# Patient Record
Sex: Female | Born: 1950
Health system: Southern US, Community
[De-identification: ages and names within clinical notes are randomized; demographics above are authoritative.]

## PROBLEM LIST (undated history)

## (undated) DIAGNOSIS — Z9889 Other specified postprocedural states: Secondary | ICD-10-CM

## (undated) DIAGNOSIS — R112 Nausea with vomiting, unspecified: Secondary | ICD-10-CM

## (undated) DIAGNOSIS — G8929 Other chronic pain: Secondary | ICD-10-CM

## (undated) DIAGNOSIS — M1990 Unspecified osteoarthritis, unspecified site: Secondary | ICD-10-CM

## (undated) DIAGNOSIS — F329 Major depressive disorder, single episode, unspecified: Secondary | ICD-10-CM

## (undated) DIAGNOSIS — F419 Anxiety disorder, unspecified: Secondary | ICD-10-CM

## (undated) DIAGNOSIS — I10 Essential (primary) hypertension: Secondary | ICD-10-CM

## (undated) DIAGNOSIS — R519 Headache, unspecified: Secondary | ICD-10-CM

## (undated) DIAGNOSIS — E039 Hypothyroidism, unspecified: Secondary | ICD-10-CM

## (undated) HISTORY — PX: TONSILLECTOMY: SUR1361

## (undated) HISTORY — DX: Anxiety disorder, unspecified: F41.9

## (undated) HISTORY — PX: BACK SURGERY: SHX140

## (undated) HISTORY — DX: Major depressive disorder, single episode, unspecified: F32.9

## (undated) HISTORY — PX: ABDOMINAL HYSTERECTOMY: SHX81

## (undated) HISTORY — DX: Unspecified osteoarthritis, unspecified site: M19.90

## (undated) HISTORY — PX: TUBAL LIGATION: SHX77

## (undated) HISTORY — PX: NASAL SINUS SURGERY: SHX719

## (undated) HISTORY — PX: CYST EXCISION: SHX5701

## (undated) HISTORY — PX: OTHER SURGICAL HISTORY: SHX169

---

## 2005-02-17 HISTORY — PX: BLADDER SURGERY: SHX569

## 2013-11-09 DIAGNOSIS — F411 Generalized anxiety disorder: Secondary | ICD-10-CM | POA: Insufficient documentation

## 2013-11-09 DIAGNOSIS — F419 Anxiety disorder, unspecified: Secondary | ICD-10-CM

## 2013-11-09 DIAGNOSIS — R52 Pain, unspecified: Secondary | ICD-10-CM | POA: Insufficient documentation

## 2013-11-09 HISTORY — DX: Anxiety disorder, unspecified: F41.9

## 2013-12-05 ENCOUNTER — Emergency Department (HOSPITAL_COMMUNITY)
Admission: EM | Admit: 2013-12-05 | Discharge: 2013-12-05 | Disposition: A | Discharge status: Home / Self Care | Payer: 59 | Attending: Emergency Medicine | Admitting: Emergency Medicine

## 2013-12-05 ENCOUNTER — Encounter (HOSPITAL_COMMUNITY): Payer: Self-pay | Admitting: Emergency Medicine

## 2013-12-05 DIAGNOSIS — F329 Major depressive disorder, single episode, unspecified: Secondary | ICD-10-CM | POA: Diagnosis not present

## 2013-12-05 DIAGNOSIS — M545 Low back pain: Secondary | ICD-10-CM | POA: Insufficient documentation

## 2013-12-05 DIAGNOSIS — M79606 Pain in leg, unspecified: Secondary | ICD-10-CM | POA: Diagnosis not present

## 2013-12-05 DIAGNOSIS — Z79899 Other long term (current) drug therapy: Secondary | ICD-10-CM | POA: Insufficient documentation

## 2013-12-05 DIAGNOSIS — I10 Essential (primary) hypertension: Secondary | ICD-10-CM | POA: Diagnosis not present

## 2013-12-05 DIAGNOSIS — M549 Dorsalgia, unspecified: Secondary | ICD-10-CM

## 2013-12-05 DIAGNOSIS — F419 Anxiety disorder, unspecified: Secondary | ICD-10-CM | POA: Diagnosis not present

## 2013-12-05 DIAGNOSIS — G8929 Other chronic pain: Secondary | ICD-10-CM | POA: Insufficient documentation

## 2013-12-05 DIAGNOSIS — Z7982 Long term (current) use of aspirin: Secondary | ICD-10-CM | POA: Insufficient documentation

## 2013-12-05 HISTORY — DX: Essential (primary) hypertension: I10

## 2013-12-05 HISTORY — DX: Other chronic pain: G89.29

## 2013-12-05 LAB — CBC WITH DIFFERENTIAL/PLATELET
Basophils Absolute: 0 10*3/uL (ref 0.0–0.1)
Basophils Relative: 0 % (ref 0–1)
Eosinophils Absolute: 0 10*3/uL (ref 0.0–0.7)
Eosinophils Relative: 0 % (ref 0–5)
HCT: 36.6 % (ref 36.0–46.0)
Hemoglobin: 12.6 g/dL (ref 12.0–15.0)
Lymphocytes Relative: 21 % (ref 12–46)
Lymphs Abs: 1.9 10*3/uL (ref 0.7–4.0)
MCH: 31.1 pg (ref 26.0–34.0)
MCHC: 34.4 g/dL (ref 30.0–36.0)
MCV: 90.4 fL (ref 78.0–100.0)
Monocytes Absolute: 0.5 10*3/uL (ref 0.1–1.0)
Monocytes Relative: 6 % (ref 3–12)
Neutro Abs: 6.6 10*3/uL (ref 1.7–7.7)
Neutrophils Relative %: 73 % (ref 43–77)
Platelets: 188 10*3/uL (ref 150–400)
RBC: 4.05 MIL/uL (ref 3.87–5.11)
RDW: 11.9 % (ref 11.5–15.5)
WBC: 9 10*3/uL (ref 4.0–10.5)

## 2013-12-05 LAB — BASIC METABOLIC PANEL
Anion gap: 14 (ref 5–15)
BUN: 9 mg/dL (ref 6–23)
CO2: 24 mEq/L (ref 19–32)
Calcium: 9.2 mg/dL (ref 8.4–10.5)
Chloride: 98 mEq/L (ref 96–112)
Creatinine, Ser: 0.58 mg/dL (ref 0.50–1.10)
GFR calc Af Amer: >90 mL/min (ref >90)
GFR calc non Af Amer: >90 mL/min (ref >90)
Glucose, Bld: 95 mg/dL (ref 70–99)
Potassium: 3.9 mEq/L (ref 3.7–5.3)
Sodium: 136 mEq/L — ABNORMAL LOW (ref 137–147)

## 2013-12-05 MED ORDER — HYDROCHLOROTHIAZIDE 12.5 MG PO CAPS: 12.5000 mg | ORAL_CAPSULE | Freq: Every day | ORAL | Status: DC

## 2013-12-05 MED ORDER — ESTRADIOL 1 MG PO TABS: 1.0000 mg | ORAL_TABLET | Freq: Every day | ORAL | Status: DC

## 2013-12-05 MED ORDER — TRAMADOL HCL 50 MG PO TABS: 50.0000 mg | ORAL_TABLET | Freq: Four times a day (QID) | ORAL | Status: DC | PRN

## 2013-12-05 MED ORDER — ACETAMINOPHEN 500 MG PO TABS
500.0000 mg | ORAL_TABLET | Freq: Four times a day (QID) | ORAL | Status: DC | PRN
Start: 2013-12-05 — End: 2013-12-06

## 2013-12-05 MED ORDER — DIAZEPAM 5 MG PO TABS: 5.0000 mg | ORAL_TABLET | Freq: Four times a day (QID) | ORAL | Status: DC | PRN

## 2013-12-05 MED ORDER — ZOLPIDEM TARTRATE 5 MG PO TABS: 10.0000 mg | ORAL_TABLET | Freq: Every day | ORAL | Status: DC

## 2013-12-05 MED ORDER — ADULT MULTIVITAMIN W/MINERALS CH: 1.0000 | ORAL_TABLET | Freq: Every evening | ORAL | Status: DC

## 2013-12-05 MED ORDER — LISINOPRIL 10 MG PO TABS: 10.0000 mg | ORAL_TABLET | Freq: Every day | ORAL | Status: DC

## 2013-12-05 MED ORDER — LISINOPRIL-HYDROCHLOROTHIAZIDE 10-12.5 MG PO TABS: 1.0000 | ORAL_TABLET | Freq: Every day | ORAL | Status: DC

## 2013-12-05 MED ORDER — ASPIRIN 81 MG PO CHEW: 81.0000 mg | CHEWABLE_TABLET | Freq: Every evening | ORAL | Status: DC

## 2013-12-05 MED ORDER — HYDROMORPHONE HCL 1 MG/ML IJ SOLN
1.0000 mg | Freq: Once | INTRAMUSCULAR | Status: AC
  Administered 2013-12-05: 1 mg via INTRAVENOUS

## 2013-12-05 MED ORDER — ZOLPIDEM TARTRATE 5 MG PO TABS: 5.0000 mg | ORAL_TABLET | Freq: Every day | ORAL | Status: DC

## 2013-12-05 MED ORDER — PAROXETINE HCL 20 MG PO TABS: 40.0000 mg | ORAL_TABLET | Freq: Every day | ORAL | Status: DC

## 2013-12-05 MED ORDER — HYDROMORPHONE HCL 1 MG/ML IJ SOLN
1.0000 mg | Freq: Once | INTRAMUSCULAR | Status: DC
  Filled 2013-12-05: qty 1

## 2013-12-05 NOTE — BH Assessment (Signed)
Tele Assessment Note   Jean Delacruz is an 63 y.o. female.  -Clinician talked to Dr. Wilson Singer regarding need for TTS.  He said that patient came in because of chronic pain and anxiety and depression associated with pain.  Pt does report that she has chronic pain in her back, legs & feet.  She says that she has difficulty with wanting to go out and do anything.  Pain slows her down.  She is in pain if she sits, stands or lays down for too long.  "The more I do, the worse I hurt."  Patient has been feeling this way for "quite a while but it has gotten worse in the last 5 years."  Patient relates that it has affected mood.  She is depressed and anxious much of the time.  She does not feel like going out and socializing and feels nervous about doing so.  Pt relates that there are times she has been very tearful.  Patient denies any current/recurrent SI, HI or A/V hallucinations.  She sees a PA in [place] who works with Dr. Delena Bali there.  That PA has been prescribing paxil and diazepam, ambien.  This PA prescribes all of her medications.  Patient takes medications as directed.  Patient describes sleep being up and down.  She describes anxiety being bad but not to the pont of anxiety attacks.  She did see a counselor, Yancey Flemings who is in [place], last week.  She called him today and he recommended coming to the ED.  A few years ago she saw Dr. Tonna Boehringer in [place] for psychiatry.  -Pt care was discussed with Patriciaann Clan, PA who recommended that patient follow up with outpatient referrals in the [place] area.  Patient care discussed with Dr. Wilson Singer who agrees with this disposition.  Patient discussed this disposition and agreed with following up with contacting Dr. Bo Merino in [place] tomorrow (10/20).  Nurse Vicente Males was given the contact number & address of Dr. Bo Merino so that it could be given to patient.  Pt to be discharged home w/ husband to follow up on referral given.  Axis I:  293.84 Anxiety D/O due to another medical condition Axis II: Deferred Axis III:  Past Medical History  Diagnosis Date  . Chronic pain   . Hypertension    Axis IV: other psychosocial or environmental problems Axis V: 51-60 moderate symptoms  Past Medical History:  Past Medical History  Diagnosis Date  . Chronic pain   . Hypertension     History reviewed. No pertinent past surgical history.  Family History: History reviewed. No pertinent family history.  Social History:  reports that she has never smoked. She does not have any smokeless tobacco history on file. She reports that she does not drink alcohol or use illicit drugs.  Additional Social History:  Alcohol / Drug Use Pain Medications: See PTA medication list Prescriptions: See PTA medication list Over the Counter: Tylenol Arthritis strength; Aleve History of alcohol / drug use?: No history of alcohol / drug abuse  CIWA: CIWA-Ar BP: 158/81 mmHg Pulse Rate: 91 COWS:    PATIENT STRENGTHS: (choose at least two) General fund of knowledge Supportive family/friends  Allergies:  Allergies  Allergen Reactions  . Gabapentin     Swelling lips   . Lyrica [Pregabalin]     Swelling lips     Home Medications:  (Not in a hospital admission)  OB/GYN Status:  No LMP recorded.  General Assessment Data Location of  Assessment: [facility] ED Is this a Tele or Face-to-Face Assessment?: Tele Assessment Is this an Initial Assessment or a Re-assessment for this encounter?: Initial Assessment Living Arrangements: Spouse/significant other Can pt return to current living arrangement?: Yes Admission Status: Voluntary Is patient capable of signing voluntary admission?: Yes Transfer from: [facility] Referral Source: Self/Family/Friend     [place] Living Arrangements: Spouse/significant other Name of Psychiatrist: None Name of Therapist: None  Education Status Highest grade of school patient has completed: 12th  grade  Risk to self with the past 6 months Suicidal Ideation: No Suicidal Intent: No Is patient at risk for suicide?: No Suicidal Plan?: No Access to Means: No What has been your use of drugs/alcohol within the last 12 months?: None Previous Attempts/Gestures: No How many times?: 0 Other Self Harm Risks: None Triggers for Past Attempts: None known Intentional Self Injurious Behavior: None Family Suicide History: Yes (PGF may have committed suicide.) Recent stressful life event(s): Recent negative physical changes ("Living in pain makes me feel stressed, overwhelmed.") Persecutory voices/beliefs?: Yes Depression: Yes Depression Symptoms: Despondent;Loss of interest in usual pleasures;Isolating;Tearfulness Substance abuse history and/or treatment for substance abuse?: No Suicide prevention information given to non-admitted patients: Not applicable  Risk to Others within the past 6 months Homicidal Ideation: No Thoughts of Harm to Others: No Current Homicidal Intent: No Current Homicidal Plan: No Access to Homicidal Means: No Identified Victim: No one History of harm to others?: No Assessment of Violence: None Noted Violent Behavior Description: Pt tense but cooperative. Does patient have access to weapons?: No Criminal Charges Pending?: No Does patient have a court date: No  Psychosis Hallucinations: None noted Delusions: None noted  Mental Status Report Appear/Hygiene: Unremarkable Eye Contact: Good Motor Activity: Freedom of movement;Unremarkable Speech: Logical/coherent Level of Consciousness: Alert Mood: Anxious;Depressed;Sad;Despair Affect: Depressed;Anxious;Sad Anxiety Level: Severe Thought Processes: Coherent;Relevant Judgement: Unimpaired Orientation: Person;Place;Time;Situation Obsessive Compulsive Thoughts/Behaviors: None  Cognitive Functioning Concentration: Decreased Memory: Recent Impaired;Remote Intact IQ: Average Insight: Fair Impulse Control:  Good Appetite: Fair Weight Loss: 0 Weight Gain: 0 Sleep: Decreased Total Hours of Sleep:  (Up and down.) Vegetative Symptoms: Staying in bed;Decreased grooming  ADLScreening [facility] Assessment Services) Patient's cognitive ability adequate to safely complete daily activities?: Yes Patient able to express need for assistance with ADLs?: Yes Independently performs ADLs?: Yes (appropriate for developmental age)  Prior Inpatient Therapy Prior Inpatient Therapy: No Prior Therapy Dates: None Prior Therapy Facilty/Provider(s): None Reason for Treatment: N/A  Prior Outpatient Therapy Prior Outpatient Therapy: Yes Prior Therapy Dates: 2014 Prior Therapy Facilty/Provider(s): [facility] in [place] Reason for Treatment: Counseling  ADL Screening (condition at time of admission) Patient's cognitive ability adequate to safely complete daily activities?: Yes Is the patient deaf or have difficulty hearing?: No Does the patient have difficulty seeing, even when wearing glasses/contacts?: No (Has glasses (doesn't like to wear them).) Does the patient have difficulty concentrating, remembering, or making decisions?: No Patient able to express need for assistance with ADLs?: Yes Does the patient have difficulty dressing or bathing?: No (Tasks take longer due to the pain.) Independently performs ADLs?: Yes (appropriate for developmental age) Does the patient have difficulty walking or climbing stairs?: Yes Weakness of Legs: Both (Right leg is worse than the other leg.) Weakness of Arms/Hands: None (Extremities stay cold.)       Abuse/Neglect Assessment (Assessment to be complete while patient is alone) Physical Abuse: Denies Verbal Abuse: Denies Sexual Abuse: Denies Exploitation of patient/patient's resources: Denies Self-Neglect: Denies Values / Beliefs Cultural Requests During Hospitalization:  None Spiritual Requests During Hospitalization: None   Advance Directives (For Healthcare) Does  patient have an advance directive?: No Would patient like information on creating an advanced directive?: No - patient declined information    Additional Information 1:1 In Past 12 Months?: No CIRT Risk: No Elopement Risk: No Does patient have medical clearance?: Yes     Disposition:  Disposition Initial Assessment Completed for this Encounter: Yes Disposition of Patient: Outpatient treatment;Referred to Type of outpatient treatment: Adult Patient referred to: Other (Comment)  Raymondo Band 12/05/2013 9:39 PM

## 2013-12-05 NOTE — ED Notes (Signed)
Koh[place], MD at bedside. 

## 2013-12-05 NOTE — ED Provider Notes (Signed)
Jean Delacruz is a 63 y.o. female with a PMHx of chronic pain and HTN who presents to the Emergency Department complaining of constant severe lower back pain that started a couple weeks ago. She reports that she has been having chronic back pain for about 5 years, but the pain has recently worsened over the past couple of weeks. She reports no injury at the start of the pain and denies any recent injuries. She describes the pain as a aching sensation. She reports that the pain radiates down her bilateral lower extremities, with the right being worse than the left. Stated that her feet have been going numb. She states that pain makes her feel "overwhelmed", stressed, and depressed. She reports some dysphoric mood, sleep disturbance and decreased appetite. Significant other that is with her reported that she has not been herself lately and that he is concerned. Reported that she cries often. She denies any SI, HI, loss of sensation, bladder incontinence, chest pain, or SOB, abdominal pain.   Patient tearful during exam. Negative signs of respiratory distress. Heart rate and rhythm normal. Lungs good auscultation to upper and lower lobes bilaterally. Negative for Ms. identified to the spine-discomfort upon palpation to lumbosacral mid spinal and paravertebral regions bilaterally. Full range of motion noted to the upper and lower extremities bilaterally without difficulty or ataxia. Pulses palpable and strong DP and PT 2+. Negative saddle paresthesias. Strength intact to upper lower extremities. Cap refill less than 3 seconds. Fingers and toes cold to the touch.  Exam unremarkable. Patient has been dealing with this discomfort for approximately 5 years, has been seen by a neurologist, primary care provider, spine surgeon. Reported that she's been more stressed than she has ever been. Tearful. Appears to be depression-denied HI SI. Patient will need to be medically cleared for depression. Moved to main ED for further  evaluation.   Jamse Mead, PA-C 12/05/13 1826

## 2013-12-05 NOTE — ED Notes (Signed)
Report given to Scott, RN

## 2013-12-05 NOTE — ED Notes (Signed)
Pt is endorsing swelling of the lips. This RN does not observe swelling. Wilson Singer, MD is aware and will come see the pt.

## 2013-12-05 NOTE — ED Notes (Signed)
Pt. Crying and upset due to pain in her back and legs this has been going on since 2011

## 2013-12-05 NOTE — ED Provider Notes (Signed)
CSN: [id number]     Arrival date & time 12/05/13  1553 History   First MD Initiated Contact with Patient 12/05/13 1647     Chief Complaint  Patient presents with  . Back Pain  . Leg Pain     (Consider location/radiation/quality/duration/timing/severity/associated sxs/prior Treatment) HPI  62yF with lower back and LE pain. Ongoing for years. Feels like cannot adequately control it. "stresses" her. Feels like she cannot function or even leave house because of it.Previous surgery. Nothing recently. Denies new trauma. No fever or chills. No urinary complaints. Denies SI/HI. Has psychiatrist in [place].   Past Medical History  Diagnosis Date  . Chronic pain   . Hypertension    History reviewed. No pertinent past surgical history. History reviewed. No pertinent family history. History  Substance Use Topics  . Smoking status: Never Smoker   . Smokeless tobacco: Not on file  . Alcohol Use: No   OB History   Grav Para Term Preterm Abortions TAB SAB Ect Mult Living                 Review of Systems  All systems reviewed and negative, other than as noted in HPI.   Allergies  Gabapentin and Lyrica  Home Medications   Prior to Admission medications   Medication Sig Start Date End Date Taking? Authorizing Provider  acetaminophen (TYLENOL) 500 MG tablet Take 500 mg by mouth every 6 (six) hours as needed for moderate pain.   Yes Historical Provider, MD  aspirin 81 MG tablet Take 81 mg by mouth every evening.   Yes Historical Provider, MD  diazepam (VALIUM) 5 MG tablet Take 5 mg by mouth every 6 (six) hours as needed for anxiety.   Yes Historical Provider, MD  estradiol (ESTRACE) 1 MG tablet Take 1 mg by mouth daily.   Yes Historical Provider, MD  KRILL OIL PO Take 1 tablet by mouth every evening.   Yes Historical Provider, MD  lisinopril-hydrochlorothiazide (PRINZIDE,ZESTORETIC) 10-12.5 MG per tablet Take 1 tablet by mouth daily.   Yes Historical Provider, MD  Multiple Vitamin  (MULTIVITAMIN WITH MINERALS) TABS tablet Take 1 tablet by mouth every evening.   Yes Historical Provider, MD  PARoxetine (PAXIL) 40 MG tablet Take 40 mg by mouth every morning.   Yes Historical Provider, MD  traMADol (ULTRAM) 50 MG tablet Take by mouth every 6 (six) hours as needed for moderate pain.   Yes Historical Provider, MD  zolpidem (AMBIEN) 10 MG tablet Take 10 mg by mouth at bedtime. Take every night per patient   Yes Historical Provider, MD   BP 163/99  Pulse 99  Temp(Src) 97.9 F (36.6 C) (Oral)  Resp 20  SpO2 100% Physical Exam  Nursing note and vitals reviewed. Constitutional: She appears well-developed and well-nourished. No distress.  HENT:  Head: Normocephalic and atraumatic.  Eyes: Conjunctivae are normal. Right eye exhibits no discharge. Left eye exhibits no discharge.  Neck: Neck supple.  Cardiovascular: Normal rate, regular rhythm and normal heart sounds.  Exam reveals no gallop and no friction rub.   No murmur heard. Pulmonary/Chest: Effort normal and breath sounds normal. No respiratory distress.  Abdominal: Soft. She exhibits no distension. There is no tenderness.  Musculoskeletal: She exhibits no edema and no tenderness.  Well-healed midline surgical scars back.   Neurological: She is alert.  Strength 5/5 b/l LE. Sensation intact to light touch. Patellar reflexes 1+ b/l. Palpable DP pulse.  Skin: Skin is warm and dry.  Psychiatric: She has a  normal mood and affect. Her behavior is normal. Thought content normal.    ED Course  Procedures (including critical care time) Labs Review Labs Reviewed  BASIC METABOLIC PANEL - Abnormal; Notable for the following:    Sodium 136 (*)    All other components within normal limits  CBC WITH DIFFERENTIAL  URINE RAPID DRUG SCREEN (HOSP PERFORMED)  URINALYSIS, ROUTINE W REFLEX MICROSCOPIC    Imaging Review No results found.   EKG Interpretation None      MDM   Final diagnoses:  Back pain, unspecified location   Anxiety    63 year old female with chronic back pain and lower extremity pain. This really seems to be more an anxiety/depression issue than anything else. Her pain complaints are chronic in nature and denies any acute change. No recent trauma. She is neurovascularly intact. Patient is extremely anxious and tearful during my exam. She denies any suicidal or homicidal ideation. She keeps saying "stress" was the reason she is so upset but will not provide me with any specifics really. Low suspicion for emergent medical process. She would like to speak with a counselor. I feel this is reasonable. I find no basis to involuntarily commit her for psychiatric reasons though. I feel she is medically stable for discharge as well.    Virgel Manifold, MD 12/12/13 [phone]

## 2013-12-05 NOTE — ED Notes (Signed)
This RN spoke with Beverely Low at [facility], who states that the pt needs to follow up with Dr. Bo Merino in [place]. Address is [address], [place] and the phone number is [phone].

## 2013-12-05 NOTE — ED Notes (Signed)
Pt c/o lower back and bilateral chronic leg pain x years worse over last months; pt sts "she thinks she has fibromyalgia"

## 2013-12-05 NOTE — ED Notes (Signed)
Pt placed in paper scrubs.

## 2013-12-05 NOTE — ED Notes (Signed)
TTS at bedside. 

## 2013-12-08 NOTE — ED Provider Notes (Signed)
Medical screening examination/treatment/procedure(s) were performed by non-physician practitioner and as supervising physician I was immediately available for consultation/collaboration.   EKG Interpretation   Date/Time:  Monday December 05 2013 20:09:37 EDT Ventricular Rate:  90 PR Interval:  150 QRS Duration: 84 QT Interval:  384 QTC Calculation: 470 R Axis:   84 Text Interpretation:  Sinus rhythm LAE, consider biatrial enlargement  Borderline right axis deviation Anteroseptal infarct, old Minimal ST  depression, lateral leads ED PHYSICIAN INTERPRETATION AVAILABLE IN [place]  [place] Confirmed by TEST, Record ([id number]) on 12/07/2013 7:52:28 AM       Virgel Manifold, MD 12/08/13 [phone]

## 2014-01-16 ENCOUNTER — Ambulatory Visit (INDEPENDENT_AMBULATORY_CARE_PROVIDER_SITE_OTHER): Payer: 59

## 2014-01-16 VITALS — BP 123/78 | HR 84 | Resp 12

## 2014-01-16 DIAGNOSIS — M792 Neuralgia and neuritis, unspecified: Secondary | ICD-10-CM

## 2014-01-16 DIAGNOSIS — R52 Pain, unspecified: Secondary | ICD-10-CM

## 2014-01-16 DIAGNOSIS — M722 Plantar fascial fibromatosis: Secondary | ICD-10-CM

## 2014-01-16 NOTE — Progress Notes (Signed)
   Subjective:    Patient ID: Jean Delacruz, female    DOB: [birth year], 63 y.o.   MRN: [id number]  HPI  PT STATED HAVE HISTORY OF PLANTAR FASCIITIS AND B/L BOTTOM FOOT ARE BEEN HURTING FOR 5 YEARS. THE FOOT ARE GETTING WORSE AND GET AGGRAVATED BY WALKING. TRIED SOAKING WITH EPSON SALT AND IT HELP SOME.  Review of Systems  HENT:       RINGING EARS  Endocrine: Positive for cold intolerance.  Musculoskeletal: Positive for myalgias and back pain.       Objective:   Physical Exam 63 year old white female well-developed well-nourished oriented 3 presents this time with a multiple year history of inferior arch pain has pain on first up in the morning or getting a fall. Of rest the pain is mid arch area of both heels nail band of plantar fascial at mid arch bilateral. Patient has no history of acute injury or trauma currently wearing Allegra type shoes also wears Birkenstock type shoes and some kind of a support in her athletic shoes as well. It hurts more when she gets tries to walk without shoes or worsen dry shoe or thin soled shoe at times. Also is having cold sensation in her feet at times. Does have a history of back problems had multiple steroid injections has had MRI for her back and nerve conduction studies done in 2011 or 2012 more than 3 years ago there were some findings of arthritis conductions were unremarkable however many things could change in 3 years and patient continues to have some evidence of radiculopathy Which are changing and abnormal sensation in her feet at times. Lower extremity objective findings as follows vascular status is intact pedal pulses palpable DP +2 over 4 bilateral PT +2 over 4 bilateral mild varicosities noted Refill time 3 seconds all digits epicritic and proprioceptive sensations intact and symmetric bilateral there is normal plantar response and DTRs. Dermatologically skin color pigment normal hair growth absent distally nails unremarkable dermatologic exam  skin color pigment hair growth again skin or pigment normal hair growth absent there is an orthopedic exam tenderness on palpation of the medial band plantar fascia x-rays reveal no inferior calcaneal spurring fascial thickening on lateral projection there is also some slight spurring of the first metatarsal head at the dorsally of lateral projection with a slight cavus or higher arch foot type being identified.       Assessment & Plan:  Assessment plantar fasciitis/heel spur syndrome is identified patient is given some information also can have posse some neuritis or neuralgia contributing to this cannot rule out a mild radiculopathy suggested if she continues to have back problems or continued temperature sensation changes would recommend follow-up with a another neurologist or back specialist may need to update her exam and MRI and nerve conduction studies in the future. At this time gave the option of a prescription orthoses which patient declined she will continue to use OTC orthoses such as the [place] or Allegria. Patient will maintain stable shoes at all times also does Tylenol or NSAID as needed for pain patient is advised there is no significant biomechanical fault in the foot no fracture no inferior spurring more likely mid band plantar fascial strain although minimal and cannot rule out a radiculopathy or neuritis. Maintain good stable shoes may be candidate for custom orthoses in the future follow-up as needed in the future if symptoms persist or Claudette Stapler DPM

## 2014-01-16 NOTE — Patient Instructions (Signed)
Plantar Fasciitis (Heel Spur Syndrome) with Rehab The plantar fascia is a fibrous, ligament-like, soft-tissue structure that spans the bottom of the foot. Plantar fasciitis is a condition that causes pain in the foot due to inflammation of the tissue. SYMPTOMS   Pain and tenderness on the underneath side of the foot.  Pain that worsens with standing or walking. CAUSES  Plantar fasciitis is caused by irritation and injury to the plantar fascia on the underneath side of the foot. Common mechanisms of injury include:  Direct trauma to bottom of the foot.  Damage to a small nerve that runs under the foot where the main fascia attaches to the heel bone.  Stress placed on the plantar fascia due to bone spurs. RISK INCREASES WITH:   Activities that place stress on the plantar fascia (running, jumping, pivoting, or cutting).  Poor strength and flexibility.  Improperly fitted shoes.  Tight calf muscles.  Flat feet.  Failure to warm-up properly before activity.  Obesity. PREVENTION  Warm up and stretch properly before activity.  Allow for adequate recovery between workouts.  Maintain physical fitness:  Strength, flexibility, and endurance.  Cardiovascular fitness.  Maintain a health body weight.  Avoid stress on the plantar fascia.  Wear properly fitted shoes, including arch supports for individuals who have flat feet. PROGNOSIS  If treated properly, then the symptoms of plantar fasciitis usually resolve without surgery. However, occasionally surgery is necessary. RELATED COMPLICATIONS   Recurrent symptoms that may result in a chronic condition.  Problems of the lower back that are caused by compensating for the injury, such as limping.  Pain or weakness of the foot during push-off following surgery.  Chronic inflammation, scarring, and partial or complete fascia tear, occurring more often from repeated injections. TREATMENT  Treatment initially involves the use of  ice and medication to help reduce pain and inflammation. The use of strengthening and stretching exercises may help reduce pain with activity, especially stretches of the Achilles tendon. These exercises may be performed at home or with a therapist. Your caregiver may recommend that you use heel cups of arch supports to help reduce stress on the plantar fascia. Occasionally, corticosteroid injections are given to reduce inflammation. If symptoms persist for greater than 6 months despite non-surgical (conservative), then surgery may be recommended.  MEDICATION   If pain medication is necessary, then nonsteroidal anti-inflammatory medications, such as aspirin and ibuprofen, or other minor pain relievers, such as acetaminophen, are often recommended.  Do not take pain medication within 7 days before surgery.  Prescription pain relievers may be given if deemed necessary by your caregiver. Use only as directed and only as much as you need.  Corticosteroid injections may be given by your caregiver. These injections should be reserved for the most serious cases, because they may only be given a certain number of times. HEAT AND COLD  Cold treatment (icing) relieves pain and reduces inflammation. Cold treatment should be applied for 10 to 15 minutes every 2 to 3 hours for inflammation and pain and immediately after any activity that aggravates your symptoms. Use ice packs or massage the area with a piece of ice (ice massage).  Heat treatment may be used prior to performing the stretching and strengthening activities prescribed by your caregiver, physical therapist, or athletic trainer. Use a heat pack or soak the injury in warm water. SEEK IMMEDIATE MEDICAL CARE IF:  Treatment seems to offer no benefit, or the condition worsens.  Any medications produce adverse side effects. EXERCISES RANGE   OF MOTION (ROM) AND STRETCHING EXERCISES - Plantar Fasciitis (Heel Spur Syndrome) These exercises may help you  when beginning to rehabilitate your injury. Your symptoms may resolve with or without further involvement from your physician, physical therapist or athletic trainer. While completing these exercises, remember:   Restoring tissue flexibility helps normal motion to return to the joints. This allows healthier, less painful movement and activity.  An effective stretch should be held for at least 30 seconds.  A stretch should never be painful. You should only feel a gentle lengthening or release in the stretched tissue. RANGE OF MOTION - Toe Extension, Flexion  Sit with your right / left leg crossed over your opposite knee.  Grasp your toes and gently pull them back toward the top of your foot. You should feel a stretch on the bottom of your toes and/or foot.  Hold this stretch for __________ seconds.  Now, gently pull your toes toward the bottom of your foot. You should feel a stretch on the top of your toes and or foot.  Hold this stretch for __________ seconds. Repeat __________ times. Complete this stretch __________ times per day.  RANGE OF MOTION - Ankle Dorsiflexion, Active Assisted  Remove shoes and sit on a chair that is preferably not on a carpeted surface.  Place right / left foot under knee. Extend your opposite leg for support.  Keeping your heel down, slide your right / left foot back toward the chair until you feel a stretch at your ankle or calf. If you do not feel a stretch, slide your bottom forward to the edge of the chair, while still keeping your heel down.  Hold this stretch for __________ seconds. Repeat __________ times. Complete this stretch __________ times per day.  STRETCH - Gastroc, Standing  Place hands on wall.  Extend right / left leg, keeping the front knee somewhat bent.  Slightly point your toes inward on your back foot.  Keeping your right / left heel on the floor and your knee straight, shift your weight toward the wall, not allowing your back to  arch.  You should feel a gentle stretch in the right / left calf. Hold this position for __________ seconds. Repeat __________ times. Complete this stretch __________ times per day. STRETCH - Soleus, Standing  Place hands on wall.  Extend right / left leg, keeping the other knee somewhat bent.  Slightly point your toes inward on your back foot.  Keep your right / left heel on the floor, bend your back knee, and slightly shift your weight over the back leg so that you feel a gentle stretch deep in your back calf.  Hold this position for __________ seconds. Repeat __________ times. Complete this stretch __________ times per day. STRETCH - Gastrocsoleus, Standing  Note: This exercise can place a lot of stress on your foot and ankle. Please complete this exercise only if specifically instructed by your caregiver.   Place the ball of your right / left foot on a step, keeping your other foot firmly on the same step.  Hold on to the wall or a rail for balance.  Slowly lift your other foot, allowing your body weight to press your heel down over the edge of the step.  You should feel a stretch in your right / left calf.  Hold this position for __________ seconds.  Repeat this exercise with a slight bend in your right / left knee. Repeat __________ times. Complete this stretch __________ times per day.    STRENGTHENING EXERCISES - Plantar Fasciitis (Heel Spur Syndrome)  These exercises may help you when beginning to rehabilitate your injury. They may resolve your symptoms with or without further involvement from your physician, physical therapist or athletic trainer. While completing these exercises, remember:   Muscles can gain both the endurance and the strength needed for everyday activities through controlled exercises.  Complete these exercises as instructed by your physician, physical therapist or athletic trainer. Progress the resistance and repetitions only as guided. STRENGTH -  Towel Curls  Sit in a chair positioned on a non-carpeted surface.  Place your foot on a towel, keeping your heel on the floor.  Pull the towel toward your heel by only curling your toes. Keep your heel on the floor.  If instructed by your physician, physical therapist or athletic trainer, add ____________________ at the end of the towel. Repeat __________ times. Complete this exercise __________ times per day. STRENGTH - Ankle Inversion  Secure one end of a rubber exercise band/tubing to a fixed object (table, pole). Loop the other end around your foot just before your toes.  Place your fists between your knees. This will focus your strengthening at your ankle.  Slowly, pull your big toe up and in, making sure the band/tubing is positioned to resist the entire motion.  Hold this position for __________ seconds.  Have your muscles resist the band/tubing as it slowly pulls your foot back to the starting position. Repeat __________ times. Complete this exercises __________ times per day.  Document Released: 02/03/2005 Document Revised: 04/28/2011 Document Reviewed: 05/18/2008 [employer] Patient Information 2015 [employer]. This information is not intended to replace advice given to you by your health care provider. Make sure you discuss any questions you have with your health care provider.  

## 2014-05-19 DIAGNOSIS — F32A Depression, unspecified: Secondary | ICD-10-CM

## 2014-05-19 DIAGNOSIS — F329 Major depressive disorder, single episode, unspecified: Secondary | ICD-10-CM | POA: Insufficient documentation

## 2014-05-19 HISTORY — DX: Depression, unspecified: F32.A

## 2014-05-19 HISTORY — DX: Major depressive disorder, single episode, unspecified: F32.9

## 2014-05-20 DIAGNOSIS — F3112 Bipolar disorder, current episode manic without psychotic features, moderate: Secondary | ICD-10-CM | POA: Insufficient documentation

## 2014-05-23 DIAGNOSIS — E673 Hypervitaminosis D: Secondary | ICD-10-CM | POA: Insufficient documentation

## 2014-08-07 DIAGNOSIS — I10 Essential (primary) hypertension: Secondary | ICD-10-CM | POA: Insufficient documentation

## 2014-08-07 DIAGNOSIS — K5900 Constipation, unspecified: Secondary | ICD-10-CM | POA: Insufficient documentation

## 2014-08-07 DIAGNOSIS — E785 Hyperlipidemia, unspecified: Secondary | ICD-10-CM | POA: Insufficient documentation

## 2015-03-13 DIAGNOSIS — F329 Major depressive disorder, single episode, unspecified: Secondary | ICD-10-CM | POA: Diagnosis not present

## 2015-03-13 DIAGNOSIS — G4700 Insomnia, unspecified: Secondary | ICD-10-CM | POA: Diagnosis not present

## 2015-03-13 DIAGNOSIS — Z6824 Body mass index (BMI) 24.0-24.9, adult: Secondary | ICD-10-CM | POA: Diagnosis not present

## 2015-03-13 DIAGNOSIS — I10 Essential (primary) hypertension: Secondary | ICD-10-CM | POA: Diagnosis not present

## 2015-03-13 DIAGNOSIS — E559 Vitamin D deficiency, unspecified: Secondary | ICD-10-CM | POA: Diagnosis not present

## 2015-03-13 DIAGNOSIS — Z9181 History of falling: Secondary | ICD-10-CM | POA: Diagnosis not present

## 2015-03-13 DIAGNOSIS — R5381 Other malaise: Secondary | ICD-10-CM | POA: Diagnosis not present

## 2015-03-13 DIAGNOSIS — Z0000 Encounter for general adult medical examination without abnormal findings: Secondary | ICD-10-CM | POA: Diagnosis not present

## 2015-03-13 DIAGNOSIS — E785 Hyperlipidemia, unspecified: Secondary | ICD-10-CM | POA: Diagnosis not present

## 2015-03-13 DIAGNOSIS — F411 Generalized anxiety disorder: Secondary | ICD-10-CM | POA: Diagnosis not present

## 2015-03-13 DIAGNOSIS — Z23 Encounter for immunization: Secondary | ICD-10-CM | POA: Diagnosis not present

## 2015-03-13 DIAGNOSIS — Z1389 Encounter for screening for other disorder: Secondary | ICD-10-CM | POA: Diagnosis not present

## 2015-04-14 DIAGNOSIS — F419 Anxiety disorder, unspecified: Secondary | ICD-10-CM | POA: Diagnosis not present

## 2015-04-14 DIAGNOSIS — I5032 Chronic diastolic (congestive) heart failure: Secondary | ICD-10-CM | POA: Diagnosis not present

## 2015-04-14 DIAGNOSIS — E782 Mixed hyperlipidemia: Secondary | ICD-10-CM | POA: Diagnosis not present

## 2015-04-14 DIAGNOSIS — N951 Menopausal and female climacteric states: Secondary | ICD-10-CM | POA: Diagnosis not present

## 2015-04-14 DIAGNOSIS — M545 Low back pain: Secondary | ICD-10-CM | POA: Diagnosis not present

## 2015-04-14 DIAGNOSIS — Z6823 Body mass index (BMI) 23.0-23.9, adult: Secondary | ICD-10-CM | POA: Diagnosis not present

## 2015-04-14 DIAGNOSIS — E039 Hypothyroidism, unspecified: Secondary | ICD-10-CM | POA: Diagnosis not present

## 2015-04-14 DIAGNOSIS — I340 Nonrheumatic mitral (valve) insufficiency: Secondary | ICD-10-CM | POA: Diagnosis not present

## 2015-04-14 DIAGNOSIS — I700 Atherosclerosis of aorta: Secondary | ICD-10-CM | POA: Diagnosis not present

## 2015-04-14 DIAGNOSIS — Z0000 Encounter for general adult medical examination without abnormal findings: Secondary | ICD-10-CM | POA: Diagnosis not present

## 2015-04-14 DIAGNOSIS — I110 Hypertensive heart disease with heart failure: Secondary | ICD-10-CM | POA: Diagnosis not present

## 2015-06-15 DIAGNOSIS — Z6822 Body mass index (BMI) 22.0-22.9, adult: Secondary | ICD-10-CM | POA: Diagnosis not present

## 2015-06-15 DIAGNOSIS — L659 Nonscarring hair loss, unspecified: Secondary | ICD-10-CM | POA: Diagnosis not present

## 2015-06-15 DIAGNOSIS — G4700 Insomnia, unspecified: Secondary | ICD-10-CM | POA: Diagnosis not present

## 2015-06-15 DIAGNOSIS — I10 Essential (primary) hypertension: Secondary | ICD-10-CM | POA: Diagnosis not present

## 2015-06-15 DIAGNOSIS — F411 Generalized anxiety disorder: Secondary | ICD-10-CM | POA: Diagnosis not present

## 2015-06-15 DIAGNOSIS — E039 Hypothyroidism, unspecified: Secondary | ICD-10-CM | POA: Diagnosis not present

## 2015-06-15 DIAGNOSIS — F329 Major depressive disorder, single episode, unspecified: Secondary | ICD-10-CM | POA: Diagnosis not present

## 2015-06-15 DIAGNOSIS — E785 Hyperlipidemia, unspecified: Secondary | ICD-10-CM | POA: Diagnosis not present

## 2015-06-15 DIAGNOSIS — M545 Low back pain: Secondary | ICD-10-CM | POA: Diagnosis not present

## 2015-06-15 DIAGNOSIS — N951 Menopausal and female climacteric states: Secondary | ICD-10-CM | POA: Diagnosis not present

## 2015-06-20 DIAGNOSIS — Z1231 Encounter for screening mammogram for malignant neoplasm of breast: Secondary | ICD-10-CM | POA: Diagnosis not present

## 2015-09-19 DIAGNOSIS — G4700 Insomnia, unspecified: Secondary | ICD-10-CM | POA: Diagnosis not present

## 2015-09-19 DIAGNOSIS — E039 Hypothyroidism, unspecified: Secondary | ICD-10-CM | POA: Diagnosis not present

## 2015-09-19 DIAGNOSIS — N951 Menopausal and female climacteric states: Secondary | ICD-10-CM | POA: Diagnosis not present

## 2015-09-19 DIAGNOSIS — I10 Essential (primary) hypertension: Secondary | ICD-10-CM | POA: Diagnosis not present

## 2015-09-19 DIAGNOSIS — F411 Generalized anxiety disorder: Secondary | ICD-10-CM | POA: Diagnosis not present

## 2015-09-19 DIAGNOSIS — R220 Localized swelling, mass and lump, head: Secondary | ICD-10-CM | POA: Diagnosis not present

## 2015-09-19 DIAGNOSIS — E785 Hyperlipidemia, unspecified: Secondary | ICD-10-CM | POA: Diagnosis not present

## 2015-10-05 DIAGNOSIS — G894 Chronic pain syndrome: Secondary | ICD-10-CM | POA: Diagnosis not present

## 2015-10-05 DIAGNOSIS — G4700 Insomnia, unspecified: Secondary | ICD-10-CM | POA: Diagnosis not present

## 2015-11-02 DIAGNOSIS — M797 Fibromyalgia: Secondary | ICD-10-CM | POA: Diagnosis not present

## 2015-11-02 DIAGNOSIS — F411 Generalized anxiety disorder: Secondary | ICD-10-CM | POA: Diagnosis not present

## 2015-11-02 DIAGNOSIS — Z6821 Body mass index (BMI) 21.0-21.9, adult: Secondary | ICD-10-CM | POA: Diagnosis not present

## 2015-11-02 DIAGNOSIS — G4700 Insomnia, unspecified: Secondary | ICD-10-CM | POA: Diagnosis not present

## 2015-11-02 DIAGNOSIS — G894 Chronic pain syndrome: Secondary | ICD-10-CM | POA: Diagnosis not present

## 2015-12-07 DIAGNOSIS — Z23 Encounter for immunization: Secondary | ICD-10-CM | POA: Diagnosis not present

## 2016-01-08 DIAGNOSIS — E785 Hyperlipidemia, unspecified: Secondary | ICD-10-CM | POA: Diagnosis not present

## 2016-01-08 DIAGNOSIS — E559 Vitamin D deficiency, unspecified: Secondary | ICD-10-CM | POA: Diagnosis not present

## 2016-01-08 DIAGNOSIS — G894 Chronic pain syndrome: Secondary | ICD-10-CM | POA: Diagnosis not present

## 2016-01-08 DIAGNOSIS — I10 Essential (primary) hypertension: Secondary | ICD-10-CM | POA: Diagnosis not present

## 2016-01-08 DIAGNOSIS — E039 Hypothyroidism, unspecified: Secondary | ICD-10-CM | POA: Diagnosis not present

## 2016-01-08 DIAGNOSIS — G4700 Insomnia, unspecified: Secondary | ICD-10-CM | POA: Diagnosis not present

## 2016-04-17 DIAGNOSIS — M8589 Other specified disorders of bone density and structure, multiple sites: Secondary | ICD-10-CM | POA: Diagnosis not present

## 2016-04-17 DIAGNOSIS — E785 Hyperlipidemia, unspecified: Secondary | ICD-10-CM | POA: Diagnosis not present

## 2016-04-17 DIAGNOSIS — Z1389 Encounter for screening for other disorder: Secondary | ICD-10-CM | POA: Diagnosis not present

## 2016-04-17 DIAGNOSIS — I10 Essential (primary) hypertension: Secondary | ICD-10-CM | POA: Diagnosis not present

## 2016-04-17 DIAGNOSIS — Z9181 History of falling: Secondary | ICD-10-CM | POA: Diagnosis not present

## 2016-04-21 DIAGNOSIS — M8589 Other specified disorders of bone density and structure, multiple sites: Secondary | ICD-10-CM | POA: Diagnosis not present

## 2016-04-21 DIAGNOSIS — Z13820 Encounter for screening for osteoporosis: Secondary | ICD-10-CM | POA: Diagnosis not present

## 2016-05-13 DIAGNOSIS — L820 Inflamed seborrheic keratosis: Secondary | ICD-10-CM | POA: Diagnosis not present

## 2016-05-13 DIAGNOSIS — L918 Other hypertrophic disorders of the skin: Secondary | ICD-10-CM | POA: Diagnosis not present

## 2016-05-13 DIAGNOSIS — L821 Other seborrheic keratosis: Secondary | ICD-10-CM | POA: Diagnosis not present

## 2016-05-13 DIAGNOSIS — L578 Other skin changes due to chronic exposure to nonionizing radiation: Secondary | ICD-10-CM | POA: Diagnosis not present

## 2016-05-20 DIAGNOSIS — H5203 Hypermetropia, bilateral: Secondary | ICD-10-CM | POA: Diagnosis not present

## 2016-05-20 DIAGNOSIS — H353131 Nonexudative age-related macular degeneration, bilateral, early dry stage: Secondary | ICD-10-CM | POA: Diagnosis not present

## 2016-05-20 DIAGNOSIS — H2513 Age-related nuclear cataract, bilateral: Secondary | ICD-10-CM | POA: Diagnosis not present

## 2016-07-24 DIAGNOSIS — Z139 Encounter for screening, unspecified: Secondary | ICD-10-CM | POA: Diagnosis not present

## 2016-07-24 DIAGNOSIS — F329 Major depressive disorder, single episode, unspecified: Secondary | ICD-10-CM | POA: Diagnosis not present

## 2016-07-24 DIAGNOSIS — I10 Essential (primary) hypertension: Secondary | ICD-10-CM | POA: Diagnosis not present

## 2016-07-24 DIAGNOSIS — F411 Generalized anxiety disorder: Secondary | ICD-10-CM | POA: Diagnosis not present

## 2016-07-24 DIAGNOSIS — E785 Hyperlipidemia, unspecified: Secondary | ICD-10-CM | POA: Diagnosis not present

## 2016-07-24 DIAGNOSIS — G4700 Insomnia, unspecified: Secondary | ICD-10-CM | POA: Diagnosis not present

## 2016-07-24 DIAGNOSIS — G894 Chronic pain syndrome: Secondary | ICD-10-CM | POA: Diagnosis not present

## 2016-08-15 DIAGNOSIS — Z1231 Encounter for screening mammogram for malignant neoplasm of breast: Secondary | ICD-10-CM | POA: Diagnosis not present

## 2016-11-19 DIAGNOSIS — H353131 Nonexudative age-related macular degeneration, bilateral, early dry stage: Secondary | ICD-10-CM | POA: Diagnosis not present

## 2016-12-05 DIAGNOSIS — Z23 Encounter for immunization: Secondary | ICD-10-CM | POA: Diagnosis not present

## 2016-12-11 DIAGNOSIS — I10 Essential (primary) hypertension: Secondary | ICD-10-CM | POA: Diagnosis not present

## 2016-12-11 DIAGNOSIS — G4700 Insomnia, unspecified: Secondary | ICD-10-CM | POA: Diagnosis not present

## 2016-12-11 DIAGNOSIS — Z139 Encounter for screening, unspecified: Secondary | ICD-10-CM | POA: Diagnosis not present

## 2016-12-11 DIAGNOSIS — F411 Generalized anxiety disorder: Secondary | ICD-10-CM | POA: Diagnosis not present

## 2017-03-04 ENCOUNTER — Ambulatory Visit (INDEPENDENT_AMBULATORY_CARE_PROVIDER_SITE_OTHER): Payer: PPO

## 2017-03-04 ENCOUNTER — Encounter: Payer: Self-pay | Admitting: Sports Medicine

## 2017-03-04 ENCOUNTER — Ambulatory Visit: Payer: PPO | Admitting: Sports Medicine

## 2017-03-04 VITALS — BP 110/61 | HR 62 | Ht 61.0 in | Wt 129.0 lb

## 2017-03-04 DIAGNOSIS — F329 Major depressive disorder, single episode, unspecified: Secondary | ICD-10-CM | POA: Diagnosis not present

## 2017-03-04 DIAGNOSIS — M79673 Pain in unspecified foot: Secondary | ICD-10-CM | POA: Diagnosis not present

## 2017-03-04 DIAGNOSIS — M722 Plantar fascial fibromatosis: Secondary | ICD-10-CM

## 2017-03-04 DIAGNOSIS — G8929 Other chronic pain: Secondary | ICD-10-CM | POA: Diagnosis not present

## 2017-03-04 DIAGNOSIS — I10 Essential (primary) hypertension: Secondary | ICD-10-CM | POA: Diagnosis not present

## 2017-03-04 DIAGNOSIS — Q667 Congenital pes cavus, unspecified foot: Secondary | ICD-10-CM

## 2017-03-04 DIAGNOSIS — E039 Hypothyroidism, unspecified: Secondary | ICD-10-CM | POA: Diagnosis not present

## 2017-03-04 DIAGNOSIS — M797 Fibromyalgia: Secondary | ICD-10-CM | POA: Diagnosis not present

## 2017-03-04 DIAGNOSIS — M5410 Radiculopathy, site unspecified: Secondary | ICD-10-CM | POA: Diagnosis not present

## 2017-03-04 DIAGNOSIS — E785 Hyperlipidemia, unspecified: Secondary | ICD-10-CM | POA: Diagnosis not present

## 2017-03-04 DIAGNOSIS — Z0000 Encounter for general adult medical examination without abnormal findings: Secondary | ICD-10-CM | POA: Diagnosis not present

## 2017-03-04 DIAGNOSIS — Z1231 Encounter for screening mammogram for malignant neoplasm of breast: Secondary | ICD-10-CM | POA: Diagnosis not present

## 2017-03-04 DIAGNOSIS — F411 Generalized anxiety disorder: Secondary | ICD-10-CM | POA: Diagnosis not present

## 2017-03-04 DIAGNOSIS — G4700 Insomnia, unspecified: Secondary | ICD-10-CM | POA: Diagnosis not present

## 2017-03-04 DIAGNOSIS — Z136 Encounter for screening for cardiovascular disorders: Secondary | ICD-10-CM | POA: Diagnosis not present

## 2017-03-04 MED ORDER — METHYLPREDNISOLONE 4 MG PO TBPK: ORAL_TABLET | ORAL | 0 refills | Status: DC

## 2017-03-04 NOTE — Patient Instructions (Addendum)
STRAPPING INSTRUCTIONS  Strapping's need to be worn for 5 days for maximum benefit.  If you next appointment is before 5 days, please remove prior to coming in.  Try to avoid putting lotion on feet during the taping process.  The tape will not stick as well and will not be as effective.  If you were given stretching exercises, start these after removal of the tape.  These exercises will actually loosen the tape and you may not receive full benefit of the strapping.  It is important that you wear sturdy shoes at all times when walking.  Bedroom shoes, slippers, flip flops, etc. are not acceptable.  **If at any time while wearing the strapping you should notice any irritation such as a rash, redness, or itching, remove the tape and wash your foot/feet thoroughly.  BATHING INSTRUCTIONS  Take a washcloth, fold it a few times and tape it around your ankle.   Then take a garbage bag, place it over your foot and angle and tape it above and below the washcloth.  TAKE A QUICK SHOWER.  The washcloth should absorb any water that runs down into the garbage bag.  If your foot gets wet, take a towel and absorb as much of the water as possible. You can also take a blow dryer, put it on low heat, and dry your bandage.    Plantar Fasciitis Rehab Ask your health care provider which exercises are safe for you. Do exercises exactly as told by your health care provider and adjust them as directed. It is normal to feel mild stretching, pulling, tightness, or discomfort as you do these exercises, but you should stop right away if you feel sudden pain or your pain gets worse. Do not begin these exercises until told by your health care provider. Stretching and range of motion exercises These exercises warm up your muscles and joints and improve the movement and flexibility of your foot. These exercises also help to relieve pain. Exercise A: Plantar fascia stretch  1. Sit with your left / right leg crossed over your  opposite knee. 2. Hold your heel with one hand with that thumb near your arch. With your other hand, hold your toes and gently pull them back toward the top of your foot. You should feel a stretch on the bottom of your toes or your foot or both. 3. Hold this stretch for__________ seconds. 4. Slowly release your toes and return to the starting position. Repeat __________ times. Complete this exercise __________ times a day. Exercise B: Gastroc, standing  1. Stand with your hands against a wall. 2. Extend your left / right leg behind you, and bend your front knee slightly. 3. Keeping your heels on the floor and keeping your back knee straight, shift your weight toward the wall without arching your back. You should feel a gentle stretch in your left / right calf. 4. Hold this position for __________ seconds. Repeat __________ times. Complete this exercise __________ times a day. Exercise C: Soleus, standing 1. Stand with your hands against a wall. 2. Extend your left / right leg behind you, and bend your front knee slightly. 3. Keeping your heels on the floor, bend your back knee and slightly shift your weight over the back leg. You should feel a gentle stretch deep in your calf. 4. Hold this position for __________ seconds. Repeat __________ times. Complete this exercise __________ times a day. Exercise D: Gastrocsoleus, standing 1. Stand with the ball of your left /  right foot on a step. The ball of your foot is on the walking surface, right under your toes. 2. Keep your other foot firmly on the same step. 3. Hold onto the wall or a railing for balance. 4. Slowly lift your other foot, allowing your body weight to press your heel down over the edge of the step. You should feel a stretch in your left / right calf. 5. Hold this position for __________ seconds. 6. Return both feet to the step. 7. Repeat this exercise with a slight bend in your left / right knee. Repeat __________ times with your  left / right knee straight and __________ times with your left / right knee bent. Complete this exercise __________ times a day. Balance exercise This exercise builds your balance and strength control of your arch to help take pressure off your plantar fascia. Exercise E: Single leg stand 1. Without shoes, stand near a railing or in a doorway. You may hold onto the railing or door frame as needed. 2. Stand on your left / right foot. Keep your big toe down on the floor and try to keep your arch lifted. Do not let your foot roll inward. 3. Hold this position for __________ seconds. 4. If this exercise is too easy, you can try it with your eyes closed or while standing on a pillow. Repeat __________ times. Complete this exercise __________ times a day. This information is not intended to replace advice given to you by your health care provider. Make sure you discuss any questions you have with your health care provider. Document Released: 02/03/2005 Document Revised: 10/09/2015 Document Reviewed: 12/18/2014 Elsevier Interactive Patient Education  2018 [employer].

## 2017-03-04 NOTE — Progress Notes (Signed)
Subjective: Cheynne Virden is a 67 y.o. female patient presents to office with complaint of heel pain that extends to arch and sometimes ball on the left and right foot, Patient has never gone away. Dealing with it for 10 years or more. Patient admits to pain with walking feels like she is walking on gravel. Had her back checked and was told that it was arthritis. Patient states that she has changed shoes and does some stretching with no additional improvement over the years. Denies any other pedal complaints.   Review of Systems  All other systems reviewed and are negative.   There are no active problems to display for this patient.   Current Outpatient Medications on File Prior to Visit  Medication Sig Dispense Refill  . estradiol (ESTRACE) 1 MG tablet Take 1 mg by mouth daily.    Marland Kitchen gabapentin (NEURONTIN) 100 MG capsule TAKE 1 CAPSULE BY MOUTH IN THE MORNING, 1 capsule IN THE afternoon, AND 2 CAPSULES AT BEDTIME  3  . levothyroxine (SYNTHROID, LEVOTHROID) 25 MCG tablet     . lisinopril-hydrochlorothiazide (PRINZIDE,ZESTORETIC) 10-12.5 MG per tablet Take 1 tablet by mouth daily.    . naproxen (NAPROSYN) 500 MG tablet TAKE 1 TABLET BY MOUTH TWICE (2) DAILY WITH food FOR PAIN  12  . pravastatin (PRAVACHOL) 20 MG tablet TAKE 1 TABLET BY MOUTH AT BEDTIME FOR CHOLESTEROL.  4  . acetaminophen (TYLENOL) 500 MG tablet Take 500 mg by mouth every 6 (six) hours as needed for moderate pain.    Marland Kitchen aspirin 81 MG tablet Take 81 mg by mouth every evening.    . clonazePAM (KLONOPIN) 0.5 MG tablet     . diazepam (VALIUM) 5 MG tablet Take 5 mg by mouth every 6 (six) hours as needed for anxiety.    . DULoxetine (CYMBALTA) 60 MG capsule     . fluticasone (FLONASE) 50 MCG/ACT nasal spray     . HYDROmorphone (DILAUDID) 2 MG tablet     . KRILL OIL PO Take 1 tablet by mouth every evening.    . Multiple Vitamin (MULTIVITAMIN WITH MINERALS) TABS tablet Take 1 tablet by mouth every evening.    Marland Kitchen PARoxetine (PAXIL)  40 MG tablet Take 40 mg by mouth every morning.    . traMADol (ULTRAM) 50 MG tablet Take by mouth every 6 (six) hours as needed for moderate pain.    Marland Kitchen zolpidem (AMBIEN) 10 MG tablet Take 10 mg by mouth at bedtime. Take every night per patient     No current facility-administered medications on file prior to visit.     Allergies  Allergen Reactions  . Gabapentin Other (See Comments) and Swelling    Swelling lips   . Pregabalin Other (See Comments) and Swelling    Swelling lips     Objective: Physical Exam General: The patient is alert and oriented x3 in no acute distress.  Dermatology: Skin is warm, dry and supple bilateral lower extremities. Nails 1-10 are normal. There is no erythema, edema, no eccymosis, no open lesions present. Integument is otherwise unremarkable.  Vascular: Dorsalis Pedis pulse and Posterior Tibial pulse are 2/4 bilateral. Capillary fill time is immediate to all digits.  Neurological: Grossly intact to light touch with an achilles reflex of +2/5 and a  negative Tinel's sign bilateral.  Musculoskeletal: Diffuse tenderness to palpation at the medial and central calcaneal tubercale and through the insertion of the plantar fascia on the left and right foot to the ball. No pain with compression  of calcaneus bilateral. No pain with tuning fork to calcaneus bilateral. No pain with calf compression bilateral. There is decreased Ankle joint range of motion bilateral. All other joints range of motion within normal limits bilateral. + Pes Cavus foot type. Strength 5/5 in all groups bilateral.   Gait: Unassisted, Antalgic avoid weight on left/right heel  Xray, Right/Left foot:  Normal osseous mineralization. Joint spaces preserved. No fracture/dislocation/boney destruction. No significant calcaneal spur present with mild thickening of plantar fascia. Pes cavus foot type bilateral. No other soft tissue abnormalities or radiopaque foreign bodies.   Assessment and  Plan: Problem List Items Addressed This Visit    None    Visit Diagnoses    Plantar fasciitis    -  Primary   Relevant Medications   methylPREDNISolone (MEDROL DOSEPAK) 4 MG TBPK tablet   Other Relevant Orders   DG Foot Complete Right   DG Foot Complete Left   Pes cavus       Relevant Medications   methylPREDNISolone (MEDROL DOSEPAK) 4 MG TBPK tablet   Radiculopathy, unspecified spinal region       Relevant Medications   gabapentin (NEURONTIN) 100 MG capsule   methylPREDNISolone (MEDROL DOSEPAK) 4 MG TBPK tablet   Chronic foot pain, unspecified laterality       Relevant Medications   gabapentin (NEURONTIN) 100 MG capsule   naproxen (NAPROSYN) 500 MG tablet   methylPREDNISolone (MEDROL DOSEPAK) 4 MG TBPK tablet      -Complete examination performed.  -Xrays reviewed -Discussed with patient in detail the condition of plantar fasciitis, how this occurs and general treatment options. Explained both conservative and surgical treatments.  -No injection today since pain was diffuse -Rx Medrol dose pack to take as instructed  -Applied plantar fascial strapping bilateral  -Recommended good supportive shoes and advised if taping works well may benefit from custom orthotics -Explained and dispensed to patient daily stretching exercises. -Recommend patient to ice affected area 1-2x daily. -Advised patient to follow up with spine specialist if she gets no additional relief with our current plan of care -Patient to return to office in 3 weeks for follow up or sooner if problems or questions arise.  Landis Martins, DPM

## 2017-03-13 ENCOUNTER — Other Ambulatory Visit: Payer: Self-pay | Admitting: Sports Medicine

## 2017-03-13 DIAGNOSIS — Q667 Congenital pes cavus, unspecified foot: Secondary | ICD-10-CM

## 2017-03-13 DIAGNOSIS — M79673 Pain in unspecified foot: Secondary | ICD-10-CM

## 2017-03-13 DIAGNOSIS — M722 Plantar fascial fibromatosis: Secondary | ICD-10-CM

## 2017-03-13 DIAGNOSIS — G8929 Other chronic pain: Secondary | ICD-10-CM

## 2017-03-25 ENCOUNTER — Encounter: Payer: Self-pay | Admitting: Sports Medicine

## 2017-03-25 ENCOUNTER — Ambulatory Visit: Payer: PPO | Admitting: Sports Medicine

## 2017-03-25 ENCOUNTER — Telehealth: Payer: Self-pay | Admitting: *Deleted

## 2017-03-25 DIAGNOSIS — M79673 Pain in unspecified foot: Secondary | ICD-10-CM

## 2017-03-25 DIAGNOSIS — M5410 Radiculopathy, site unspecified: Secondary | ICD-10-CM

## 2017-03-25 DIAGNOSIS — M722 Plantar fascial fibromatosis: Secondary | ICD-10-CM

## 2017-03-25 DIAGNOSIS — Q667 Congenital pes cavus, unspecified foot: Secondary | ICD-10-CM

## 2017-03-25 DIAGNOSIS — M779 Enthesopathy, unspecified: Secondary | ICD-10-CM

## 2017-03-25 DIAGNOSIS — G8929 Other chronic pain: Secondary | ICD-10-CM

## 2017-03-25 DIAGNOSIS — M7750 Other enthesopathy of unspecified foot: Secondary | ICD-10-CM

## 2017-03-25 NOTE — Telephone Encounter (Signed)
-----   Message from Jean Delacruz, [place] sent at 03/25/2017 11:29 AM EST ----- Regarding: Refer to Spine Specialist  Patient wants to see a spine specialist about her back in [place]. She thinks there may be a Dr. Judithe Modest Dr. Cristine Polio that she prefers to see Thanks Dr. Chauncey Cruel

## 2017-03-25 NOTE — Progress Notes (Signed)
Subjective: Jean Delacruz is a 67 y.o. female patient returns to office with complaint of now pain at the balls of the feet states that her heel and arch pain is somewhat better however did not notice much difference in other areas of feet with the taping and reports that the steroid medication made her anxious.  Patient reports continued back pain and is having difficulty in this area.  Denies any other pedal complaints.    There are no active problems to display for this patient.   Current Outpatient Medications on File Prior to Visit  Medication Sig Dispense Refill  . acetaminophen (TYLENOL) 500 MG tablet Take 500 mg by mouth every 6 (six) hours as needed for moderate pain.    Marland Kitchen aspirin 81 MG tablet Take 81 mg by mouth every evening.    . clonazePAM (KLONOPIN) 0.5 MG tablet     . diazepam (VALIUM) 5 MG tablet Take 5 mg by mouth every 6 (six) hours as needed for anxiety.    . DULoxetine (CYMBALTA) 60 MG capsule     . estradiol (ESTRACE) 1 MG tablet Take 1 mg by mouth daily.    . fluticasone (FLONASE) 50 MCG/ACT nasal spray     . gabapentin (NEURONTIN) 100 MG capsule TAKE 1 CAPSULE BY MOUTH IN THE MORNING, 1 capsule IN THE afternoon, AND 2 CAPSULES AT BEDTIME  3  . HYDROmorphone (DILAUDID) 2 MG tablet     . KRILL OIL PO Take 1 tablet by mouth every evening.    Marland Kitchen levothyroxine (SYNTHROID, LEVOTHROID) 25 MCG tablet     . lisinopril-hydrochlorothiazide (PRINZIDE,ZESTORETIC) 10-12.5 MG per tablet Take 1 tablet by mouth daily.    . methylPREDNISolone (MEDROL DOSEPAK) 4 MG TBPK tablet Take as instructed 21 tablet 0  . Multiple Vitamin (MULTIVITAMIN WITH MINERALS) TABS tablet Take 1 tablet by mouth every evening.    . naproxen (NAPROSYN) 500 MG tablet TAKE 1 TABLET BY MOUTH TWICE (2) DAILY WITH food FOR PAIN  12  . PARoxetine (PAXIL) 40 MG tablet Take 40 mg by mouth every morning.    . pravastatin (PRAVACHOL) 20 MG tablet TAKE 1 TABLET BY MOUTH AT BEDTIME FOR CHOLESTEROL.  4  . traMADol  (ULTRAM) 50 MG tablet Take by mouth every 6 (six) hours as needed for moderate pain.    Marland Kitchen zolpidem (AMBIEN) 10 MG tablet Take 10 mg by mouth at bedtime. Take every night per patient     No current facility-administered medications on file prior to visit.     Allergies  Allergen Reactions  . Gabapentin Other (See Comments) and Swelling    Swelling lips   . Pregabalin Other (See Comments) and Swelling    Swelling lips     Objective: Physical Exam General: The patient is alert and oriented x3 in no acute distress.  Dermatology: Skin is warm, dry and supple bilateral lower extremities. Nails 1-10 are normal. There is no erythema, edema, no eccymosis, no open lesions present. Integument is otherwise unremarkable.  Vascular: Dorsalis Pedis pulse and Posterior Tibial pulse are 2/4 bilateral. Capillary fill time is immediate to all digits.  Neurological: Grossly intact to light touch with an achilles reflex of +2/5 and a  negative Tinel's sign bilateral.  Musculoskeletal: Decreased tenderness to palpation at the medial and central calcaneal tubercale and through the insertion of the plantar fascia on the left and right foot. There is tenderness to the right and left ball most noticeably proximal to the second metatarsal heads bilateral/distal arch.  No palpable mass no clicking popping or grinding Mulder sign negative. There is bony exostosis at the lateral foot and ankle more prominent on the left greater than the right nonpulsatile consistent with changes significant for osteoarthritis.  There is decreased Ankle and mid-tarsal joint range of motion bilateral. All other joints range of motion within normal limits bilateral. + Pes Cavus foot type. Strength 5/5 in all groups bilateral.   Assessment and Plan: Problem List Items Addressed This Visit    None    Visit Diagnoses    Capsulitis    -  Primary   Plantar fasciitis       Pes cavus       Radiculopathy, unspecified spinal region        Chronic foot pain, unspecified laterality         -Complete examination performed.  -Discussed with patient in detail the condition of capsulitis at the ball secondary to foot structure and chronic plantar fasciitis, how this occurs and general treatment options. Explained both conservative and surgical treatments.  -After oral consent and aseptic prep, injected a mixture containing 1 ml of 2%  plain lidocaine, 1 ml 0.5% plain marcaine, 0.5 ml of kenalog 10 and 0.5 ml of dexamethasone phosphate into right and left second metatarsal phalangeal joint via a plantar approach at the area of most tenderness without complication. Post-injection care discussed with patient.  -Advised patient if after 1 week if area is still painful to call for a prescription of diclofenac which will replace her current prescription of naproxen to see if this will give her any additional anti-inflammatory relief -Recommended good supportive shoes and advised over-the-counter insoles -Continue with daily stretching exercises. -Recommend patient to continue to ice affected area 1-2x daily. -Referral made to spine specialist since patient has a chronic history of back issues for further evaluation; Patient requests to see a specialist in [facility] and thinks that there is a doctor named Dr. Patrice Paradise that she would like to see. -Patient to return to office as needed for follow up or sooner if problems or questions arise.  Landis Martins, DPM

## 2017-03-25 NOTE — Telephone Encounter (Addendum)
Sent referral by email and clinicals with Demographics to [facility].

## 2017-03-26 NOTE — Telephone Encounter (Signed)
Faxed required form, clinicals and demographics to [facility].

## 2017-03-27 ENCOUNTER — Telehealth: Payer: Self-pay | Admitting: Sports Medicine

## 2017-03-27 NOTE — Telephone Encounter (Signed)
On my AVS from my visit on 06 February the medication list is incorrect. When I came in January I told them I was not taking all this medicine because it is medicine I was taking in 2015 and I no longer take the medication. So I don't understand why it has to be on here when its not correct. If you could check and let me know why. Please call me back at [phone] and you can leave a message if I'm not here.

## 2017-04-02 ENCOUNTER — Telehealth: Payer: Self-pay | Admitting: *Deleted

## 2017-04-02 NOTE — Telephone Encounter (Signed)
Received faxed note from [facility] stating not in [place].

## 2017-04-03 NOTE — Telephone Encounter (Signed)
Pt states the shot are working so she doesn't think she would like a referral to the [facility].

## 2017-04-03 NOTE — Telephone Encounter (Addendum)
03/31/2017-Received notice [facility] are not in-network with [employer]. 04/03/2017-Dawn - [facility] states they do accept [employer], but pt would have to contact insurance to make sure they are in network.

## 2017-04-03 NOTE — Telephone Encounter (Signed)
Left message informing pt [facility] are not in network with her insurance, that I called [facility] they do accep [employer], and pt will need to contact [employer] to find if they are in network. I gave contact and location information to pt for [facility].

## 2017-05-21 DIAGNOSIS — L820 Inflamed seborrheic keratosis: Secondary | ICD-10-CM | POA: Diagnosis not present

## 2017-05-21 DIAGNOSIS — L578 Other skin changes due to chronic exposure to nonionizing radiation: Secondary | ICD-10-CM | POA: Diagnosis not present

## 2017-05-21 DIAGNOSIS — H353131 Nonexudative age-related macular degeneration, bilateral, early dry stage: Secondary | ICD-10-CM | POA: Diagnosis not present

## 2017-05-21 DIAGNOSIS — L821 Other seborrheic keratosis: Secondary | ICD-10-CM | POA: Diagnosis not present

## 2017-05-21 DIAGNOSIS — H2513 Age-related nuclear cataract, bilateral: Secondary | ICD-10-CM | POA: Diagnosis not present

## 2017-05-27 DIAGNOSIS — M4316 Spondylolisthesis, lumbar region: Secondary | ICD-10-CM | POA: Diagnosis not present

## 2017-05-30 DIAGNOSIS — M5136 Other intervertebral disc degeneration, lumbar region: Secondary | ICD-10-CM | POA: Diagnosis not present

## 2017-05-30 DIAGNOSIS — M48061 Spinal stenosis, lumbar region without neurogenic claudication: Secondary | ICD-10-CM | POA: Diagnosis not present

## 2017-05-30 DIAGNOSIS — M4316 Spondylolisthesis, lumbar region: Secondary | ICD-10-CM | POA: Diagnosis not present

## 2017-05-30 DIAGNOSIS — M47816 Spondylosis without myelopathy or radiculopathy, lumbar region: Secondary | ICD-10-CM | POA: Diagnosis not present

## 2017-06-04 DIAGNOSIS — M545 Low back pain: Secondary | ICD-10-CM | POA: Diagnosis not present

## 2017-06-16 DIAGNOSIS — R0989 Other specified symptoms and signs involving the circulatory and respiratory systems: Secondary | ICD-10-CM | POA: Diagnosis not present

## 2017-06-16 DIAGNOSIS — H6123 Impacted cerumen, bilateral: Secondary | ICD-10-CM | POA: Diagnosis not present

## 2017-06-16 DIAGNOSIS — J3489 Other specified disorders of nose and nasal sinuses: Secondary | ICD-10-CM | POA: Diagnosis not present

## 2017-06-16 DIAGNOSIS — R12 Heartburn: Secondary | ICD-10-CM | POA: Diagnosis not present

## 2017-06-16 DIAGNOSIS — K219 Gastro-esophageal reflux disease without esophagitis: Secondary | ICD-10-CM | POA: Diagnosis not present

## 2017-06-16 DIAGNOSIS — Z87898 Personal history of other specified conditions: Secondary | ICD-10-CM | POA: Diagnosis not present

## 2017-06-19 DIAGNOSIS — M5116 Intervertebral disc disorders with radiculopathy, lumbar region: Secondary | ICD-10-CM | POA: Diagnosis not present

## 2017-06-19 DIAGNOSIS — M545 Low back pain: Secondary | ICD-10-CM | POA: Diagnosis not present

## 2017-06-19 DIAGNOSIS — M5136 Other intervertebral disc degeneration, lumbar region: Secondary | ICD-10-CM | POA: Diagnosis not present

## 2017-08-11 DIAGNOSIS — M545 Low back pain: Secondary | ICD-10-CM | POA: Diagnosis not present

## 2017-08-11 DIAGNOSIS — M5116 Intervertebral disc disorders with radiculopathy, lumbar region: Secondary | ICD-10-CM | POA: Diagnosis not present

## 2017-08-17 DIAGNOSIS — Z1231 Encounter for screening mammogram for malignant neoplasm of breast: Secondary | ICD-10-CM | POA: Diagnosis not present

## 2017-10-06 DIAGNOSIS — M545 Low back pain: Secondary | ICD-10-CM | POA: Diagnosis not present

## 2017-10-29 DIAGNOSIS — F411 Generalized anxiety disorder: Secondary | ICD-10-CM | POA: Diagnosis not present

## 2017-10-29 DIAGNOSIS — E785 Hyperlipidemia, unspecified: Secondary | ICD-10-CM | POA: Diagnosis not present

## 2017-10-29 DIAGNOSIS — I10 Essential (primary) hypertension: Secondary | ICD-10-CM | POA: Diagnosis not present

## 2017-10-29 DIAGNOSIS — Z9181 History of falling: Secondary | ICD-10-CM | POA: Diagnosis not present

## 2017-10-29 DIAGNOSIS — E663 Overweight: Secondary | ICD-10-CM | POA: Diagnosis not present

## 2017-10-29 DIAGNOSIS — G4700 Insomnia, unspecified: Secondary | ICD-10-CM | POA: Diagnosis not present

## 2017-10-29 DIAGNOSIS — Z23 Encounter for immunization: Secondary | ICD-10-CM | POA: Diagnosis not present

## 2017-10-29 DIAGNOSIS — E039 Hypothyroidism, unspecified: Secondary | ICD-10-CM | POA: Diagnosis not present

## 2017-10-29 DIAGNOSIS — Z1339 Encounter for screening examination for other mental health and behavioral disorders: Secondary | ICD-10-CM | POA: Diagnosis not present

## 2017-10-29 DIAGNOSIS — Z1331 Encounter for screening for depression: Secondary | ICD-10-CM | POA: Diagnosis not present

## 2017-10-29 DIAGNOSIS — N951 Menopausal and female climacteric states: Secondary | ICD-10-CM | POA: Diagnosis not present

## 2017-11-17 DIAGNOSIS — N3946 Mixed incontinence: Secondary | ICD-10-CM | POA: Diagnosis not present

## 2017-11-17 DIAGNOSIS — M4316 Spondylolisthesis, lumbar region: Secondary | ICD-10-CM | POA: Diagnosis not present

## 2017-11-30 DIAGNOSIS — H2513 Age-related nuclear cataract, bilateral: Secondary | ICD-10-CM | POA: Diagnosis not present

## 2017-11-30 DIAGNOSIS — H5203 Hypermetropia, bilateral: Secondary | ICD-10-CM | POA: Diagnosis not present

## 2017-11-30 DIAGNOSIS — H353131 Nonexudative age-related macular degeneration, bilateral, early dry stage: Secondary | ICD-10-CM | POA: Diagnosis not present

## 2018-02-02 DIAGNOSIS — R143 Flatulence: Secondary | ICD-10-CM | POA: Diagnosis not present

## 2018-02-02 DIAGNOSIS — R142 Eructation: Secondary | ICD-10-CM | POA: Diagnosis not present

## 2018-03-16 DIAGNOSIS — M4316 Spondylolisthesis, lumbar region: Secondary | ICD-10-CM | POA: Diagnosis not present

## 2018-04-28 DIAGNOSIS — M4310 Spondylolisthesis, site unspecified: Secondary | ICD-10-CM | POA: Diagnosis not present

## 2018-04-29 DIAGNOSIS — E785 Hyperlipidemia, unspecified: Secondary | ICD-10-CM | POA: Diagnosis not present

## 2018-04-29 DIAGNOSIS — I10 Essential (primary) hypertension: Secondary | ICD-10-CM | POA: Diagnosis not present

## 2018-04-29 DIAGNOSIS — Z6825 Body mass index (BMI) 25.0-25.9, adult: Secondary | ICD-10-CM | POA: Diagnosis not present

## 2018-04-29 DIAGNOSIS — F3341 Major depressive disorder, recurrent, in partial remission: Secondary | ICD-10-CM | POA: Diagnosis not present

## 2018-04-29 DIAGNOSIS — E663 Overweight: Secondary | ICD-10-CM | POA: Diagnosis not present

## 2018-04-29 DIAGNOSIS — Z139 Encounter for screening, unspecified: Secondary | ICD-10-CM | POA: Diagnosis not present

## 2018-05-04 DIAGNOSIS — M4310 Spondylolisthesis, site unspecified: Secondary | ICD-10-CM | POA: Diagnosis not present

## 2018-05-04 DIAGNOSIS — M545 Low back pain: Secondary | ICD-10-CM | POA: Diagnosis not present

## 2018-05-05 DIAGNOSIS — Z1331 Encounter for screening for depression: Secondary | ICD-10-CM | POA: Diagnosis not present

## 2018-05-05 DIAGNOSIS — Z9181 History of falling: Secondary | ICD-10-CM | POA: Diagnosis not present

## 2018-05-05 DIAGNOSIS — E785 Hyperlipidemia, unspecified: Secondary | ICD-10-CM | POA: Diagnosis not present

## 2018-05-05 DIAGNOSIS — Z1231 Encounter for screening mammogram for malignant neoplasm of breast: Secondary | ICD-10-CM | POA: Diagnosis not present

## 2018-05-05 DIAGNOSIS — Z1211 Encounter for screening for malignant neoplasm of colon: Secondary | ICD-10-CM | POA: Diagnosis not present

## 2018-05-05 DIAGNOSIS — Z0000 Encounter for general adult medical examination without abnormal findings: Secondary | ICD-10-CM | POA: Diagnosis not present

## 2018-05-25 DIAGNOSIS — Z7902 Long term (current) use of antithrombotics/antiplatelets: Secondary | ICD-10-CM | POA: Diagnosis not present

## 2018-05-25 DIAGNOSIS — Z87891 Personal history of nicotine dependence: Secondary | ICD-10-CM | POA: Diagnosis not present

## 2018-05-25 DIAGNOSIS — Z79899 Other long term (current) drug therapy: Secondary | ICD-10-CM | POA: Diagnosis not present

## 2018-05-25 DIAGNOSIS — N189 Chronic kidney disease, unspecified: Secondary | ICD-10-CM | POA: Diagnosis not present

## 2018-05-25 DIAGNOSIS — I1310 Hypertensive heart and chronic kidney disease without heart failure, with stage 1 through stage 4 chronic kidney disease, or unspecified chronic kidney disease: Secondary | ICD-10-CM | POA: Diagnosis not present

## 2018-05-25 DIAGNOSIS — I2510 Atherosclerotic heart disease of native coronary artery without angina pectoris: Secondary | ICD-10-CM | POA: Diagnosis not present

## 2018-05-25 DIAGNOSIS — M4310 Spondylolisthesis, site unspecified: Secondary | ICD-10-CM | POA: Diagnosis not present

## 2018-05-25 DIAGNOSIS — C44321 Squamous cell carcinoma of skin of nose: Secondary | ICD-10-CM | POA: Diagnosis not present

## 2018-05-25 DIAGNOSIS — M950 Acquired deformity of nose: Secondary | ICD-10-CM | POA: Diagnosis not present

## 2018-09-13 DIAGNOSIS — Z1231 Encounter for screening mammogram for malignant neoplasm of breast: Secondary | ICD-10-CM | POA: Diagnosis not present

## 2018-12-07 DIAGNOSIS — E785 Hyperlipidemia, unspecified: Secondary | ICD-10-CM | POA: Diagnosis not present

## 2018-12-07 DIAGNOSIS — M545 Low back pain: Secondary | ICD-10-CM | POA: Diagnosis not present

## 2018-12-07 DIAGNOSIS — F3341 Major depressive disorder, recurrent, in partial remission: Secondary | ICD-10-CM | POA: Diagnosis not present

## 2018-12-07 DIAGNOSIS — Z23 Encounter for immunization: Secondary | ICD-10-CM | POA: Diagnosis not present

## 2018-12-07 DIAGNOSIS — Z6824 Body mass index (BMI) 24.0-24.9, adult: Secondary | ICD-10-CM | POA: Diagnosis not present

## 2018-12-07 DIAGNOSIS — G4700 Insomnia, unspecified: Secondary | ICD-10-CM | POA: Diagnosis not present

## 2018-12-07 DIAGNOSIS — L659 Nonscarring hair loss, unspecified: Secondary | ICD-10-CM | POA: Diagnosis not present

## 2018-12-07 DIAGNOSIS — I10 Essential (primary) hypertension: Secondary | ICD-10-CM | POA: Diagnosis not present

## 2018-12-07 DIAGNOSIS — G8929 Other chronic pain: Secondary | ICD-10-CM | POA: Diagnosis not present

## 2018-12-07 DIAGNOSIS — E039 Hypothyroidism, unspecified: Secondary | ICD-10-CM | POA: Diagnosis not present

## 2019-01-21 ENCOUNTER — Other Ambulatory Visit: Payer: Self-pay | Admitting: Neurosurgery

## 2019-01-25 DIAGNOSIS — M4310 Spondylolisthesis, site unspecified: Secondary | ICD-10-CM | POA: Diagnosis not present

## 2019-01-27 DIAGNOSIS — H25813 Combined forms of age-related cataract, bilateral: Secondary | ICD-10-CM | POA: Diagnosis not present

## 2019-01-27 DIAGNOSIS — H353131 Nonexudative age-related macular degeneration, bilateral, early dry stage: Secondary | ICD-10-CM | POA: Diagnosis not present

## 2019-01-27 DIAGNOSIS — H524 Presbyopia: Secondary | ICD-10-CM | POA: Diagnosis not present

## 2019-02-07 DIAGNOSIS — M4310 Spondylolisthesis, site unspecified: Secondary | ICD-10-CM | POA: Diagnosis not present

## 2019-02-16 NOTE — Pre-Procedure Instructions (Signed)
[place], [place] [address] Phone: [phone] Fax: [phone]    Your procedure is scheduled on Mon., Jan. 4, 2020 from 8:00AM-11:30AM  Report to [facility] Entrance "A" at 6:00AM  Call this number if you have problems the morning of surgery:  [phone]   Remember:  Do not eat or drink after midnight on Jan. 3rd    Take these medicines the morning of surgery with A SIP OF WATER: Estradiol (ESTRACE) Gabapentin (NEURONTIN) Levothyroxine (SYNTHROID, LEVOTHROID)  If Needed: Fluticasone (FLONASE) TraMADol (ULTRAM)  As of today, stop taking all Aspirin (unless instructed by your doctor) and Other Aspirin containing products, Vitamins, Fish oils, and Herbal medications. Also stop all NSAIDS i.e. Advil, Ibuprofen, Motrin, Aleve, Anaprox, Naproxen, BC, Goody Powders, and all Supplements.  No Smoking of any kind, Tobacco, or Alcohol products 24 hours prior to your procedure. If you use a Cpap at night, you may bring machine and all equipment for your overnight stay.   Special instructions:   [facility]- Preparing For Surgery  Before surgery, you can play an important role. Because skin is not sterile, your skin needs to be as free of germs as possible. You can reduce the number of germs on your skin by washing with CHG (chlorahexidine gluconate) Soap before surgery.  CHG is an antiseptic cleaner which kills germs and bonds with the skin to continue killing germs even after washing.    Please do not use if you have an allergy to CHG or antibacterial soaps. If your skin becomes reddened/irritated stop using the CHG.  Do not shave (including legs and underarms) for at least 48 hours prior to first CHG shower. It is OK to shave your face.  Please follow these instructions carefully.   1. Shower the NIGHT BEFORE SURGERY and the MORNING OF SURGERY with CHG.   2. If you chose to wash your hair, wash your hair first  as usual with your normal shampoo.  3. After you shampoo, rinse your hair and body thoroughly to remove the shampoo.  4. Use CHG as you would any other liquid soap. You can apply CHG directly to the skin and wash gently with a scrungie or a clean washcloth.   5. Apply the CHG Soap to your body ONLY FROM THE NECK DOWN.  Do not use on open wounds or open sores. Avoid contact with your eyes, ears, mouth and genitals (private parts). Wash Face and genitals (private parts)  with your normal soap.  6. Wash thoroughly, paying special attention to the area where your surgery will be performed.  7. Thoroughly rinse your body with warm water from the neck down.  8. DO NOT shower/wash with your normal soap after using and rinsing off the CHG Soap.  9. Pat yourself dry with a CLEAN TOWEL.  10. Wear CLEAN PAJAMAS to bed the night before surgery, wear comfortable clothes the morning of surgery  11. Place CLEAN SHEETS on your bed the night of your first shower and DO NOT SLEEP WITH PETS.   Day of Surgery:           Remember to brush your teeth WITH YOUR REGULAR TOOTHPASTE  Do not wear jewelry, make-up or nail polish.  Do not wear lotions, powders, or perfumes, or deodorant.  Do not shave 48 hours prior to surgery.    Do not bring valuables to the hospital.  [facility] is not  responsible for any belongings or valuables.  Contacts, dentures or bridgework may not be worn into surgery.    For patients admitted to the hospital, discharge time will be determined by your treatment team.  Patients discharged the day of surgery will not be allowed to drive home, and someone age 69 and over needs to stay with them for 24 hours.  Please wear clean clothes to the hospital/surgery center.    Please read over the following fact sheets that you were given.

## 2019-02-17 ENCOUNTER — Encounter (HOSPITAL_COMMUNITY): Payer: Self-pay

## 2019-02-17 ENCOUNTER — Encounter (HOSPITAL_COMMUNITY)
Admission: RE | Admit: 2019-02-17 | Discharge: 2019-02-17 | Disposition: A | Discharge status: Home / Self Care | Payer: PPO | Source: Ambulatory Visit | Attending: Neurosurgery | Admitting: Neurosurgery

## 2019-02-17 ENCOUNTER — Other Ambulatory Visit: Payer: Self-pay

## 2019-02-17 ENCOUNTER — Other Ambulatory Visit (HOSPITAL_COMMUNITY)
Admission: RE | Admit: 2019-02-17 | Discharge: 2019-02-17 | Disposition: A | Discharge status: Home / Self Care | Payer: PPO | Source: Ambulatory Visit | Attending: Neurosurgery | Admitting: Neurosurgery

## 2019-02-17 DIAGNOSIS — Z01818 Encounter for other preprocedural examination: Secondary | ICD-10-CM | POA: Insufficient documentation

## 2019-02-17 DIAGNOSIS — R9431 Abnormal electrocardiogram [ECG] [EKG]: Secondary | ICD-10-CM | POA: Insufficient documentation

## 2019-02-17 DIAGNOSIS — Z20828 Contact with and (suspected) exposure to other viral communicable diseases: Secondary | ICD-10-CM | POA: Insufficient documentation

## 2019-02-17 HISTORY — DX: Other specified postprocedural states: Z98.890

## 2019-02-17 HISTORY — DX: Nausea with vomiting, unspecified: R11.2

## 2019-02-17 HISTORY — DX: Hypothyroidism, unspecified: E03.9

## 2019-02-17 LAB — ABO/RH: ABO/RH(D): A POS

## 2019-02-17 LAB — CBC WITH DIFFERENTIAL/PLATELET
Abs Immature Granulocytes: 0.02 10*3/uL (ref 0.00–0.07)
Basophils Absolute: 0.1 10*3/uL (ref 0.0–0.1)
Basophils Relative: 1 %
Eosinophils Absolute: 0.2 10*3/uL (ref 0.0–0.5)
Eosinophils Relative: 3 %
HCT: 40.2 % (ref 36.0–46.0)
Hemoglobin: 13.3 g/dL (ref 12.0–15.0)
Immature Granulocytes: 0 %
Lymphocytes Relative: 32 %
Lymphs Abs: 2 10*3/uL (ref 0.7–4.0)
MCH: 30.5 pg (ref 26.0–34.0)
MCHC: 33.1 g/dL (ref 30.0–36.0)
MCV: 92.2 fL (ref 80.0–100.0)
Monocytes Absolute: 0.6 10*3/uL (ref 0.1–1.0)
Monocytes Relative: 10 %
Neutro Abs: 3.4 10*3/uL (ref 1.7–7.7)
Neutrophils Relative %: 54 %
Platelets: 224 10*3/uL (ref 150–400)
RBC: 4.36 MIL/uL (ref 3.87–5.11)
RDW: 11.8 % (ref 11.5–15.5)
WBC: 6.2 10*3/uL (ref 4.0–10.5)
nRBC: 0 % (ref 0.0–0.2)

## 2019-02-17 LAB — BASIC METABOLIC PANEL
Anion gap: 8 (ref 5–15)
BUN: 14 mg/dL (ref 8–23)
CO2: 29 mmol/L (ref 22–32)
Calcium: 9.5 mg/dL (ref 8.9–10.3)
Chloride: 102 mmol/L (ref 98–111)
Creatinine, Ser: 0.87 mg/dL (ref 0.44–1.00)
GFR calc Af Amer: >60 mL/min (ref >60)
GFR calc non Af Amer: >60 mL/min (ref >60)
Glucose, Bld: 95 mg/dL (ref 70–99)
Potassium: 3.6 mmol/L (ref 3.5–5.1)
Sodium: 139 mmol/L (ref 135–145)

## 2019-02-17 LAB — TYPE AND SCREEN
ABO/RH(D): A POS
Antibody Screen: NEGATIVE

## 2019-02-17 LAB — SURGICAL PCR SCREEN
MRSA, PCR: NEGATIVE
Staphylococcus aureus: POSITIVE — AB

## 2019-02-17 NOTE — Progress Notes (Addendum)
PCP - amy Moon,PA @[facility]  [place] Cardiologist - na    Chest x-ray - na EKG - 02/17/19 Stress Test - na ECHO - na Cardiac Cath - na  Sleep Study - na CPAP -   Fasting Blood Sugar - na Checks Blood Sugar _____ times a day  Blood Thinner Instructions:na Aspirin Instructions:  COVID TEST- 02/17/19   Anesthesia review: reviewed with Jean Rinks burns,PA pt. Ekg. Stated it was okay for surgery  Patient denies shortness of breath, fever, cough and chest pain at PAT appointment   All instructions explained to the patient, with a verbal understanding of the material. Patient agrees to go over the instructions while at home for a better understanding. Patient also instructed to self quarantine after being tested for COVID-19. The opportunity to ask questions was provided.

## 2019-02-18 LAB — NOVEL CORONAVIRUS, NAA (HOSP ORDER, SEND-OUT TO REF LAB; TAT 18-24 HRS): SARS-CoV-2, NAA: NOT DETECTED

## 2019-02-21 ENCOUNTER — Encounter (HOSPITAL_COMMUNITY)
Admission: RE | Disposition: A | Discharge status: Home / Self Care | Payer: Self-pay | Source: Home / Self Care | Attending: Neurosurgery

## 2019-02-21 ENCOUNTER — Inpatient Hospital Stay (HOSPITAL_COMMUNITY): Payer: PPO | Admitting: Physician Assistant

## 2019-02-21 ENCOUNTER — Other Ambulatory Visit: Payer: Self-pay

## 2019-02-21 ENCOUNTER — Encounter (HOSPITAL_COMMUNITY): Payer: Self-pay | Admitting: Neurosurgery

## 2019-02-21 ENCOUNTER — Inpatient Hospital Stay (HOSPITAL_COMMUNITY): Payer: PPO

## 2019-02-21 ENCOUNTER — Inpatient Hospital Stay (HOSPITAL_COMMUNITY): Payer: PPO | Admitting: Certified Registered Nurse Anesthetist

## 2019-02-21 ENCOUNTER — Inpatient Hospital Stay (HOSPITAL_COMMUNITY)
Admission: RE | Admit: 2019-02-21 | Discharge: 2019-02-22 | DRG: 460 | Disposition: A | Discharge status: Home / Self Care | Payer: PPO | Attending: Neurosurgery | Admitting: Neurosurgery

## 2019-02-21 DIAGNOSIS — I10 Essential (primary) hypertension: Secondary | ICD-10-CM | POA: Diagnosis present

## 2019-02-21 DIAGNOSIS — Z20822 Contact with and (suspected) exposure to covid-19: Secondary | ICD-10-CM | POA: Diagnosis not present

## 2019-02-21 DIAGNOSIS — Z888 Allergy status to other drugs, medicaments and biological substances status: Secondary | ICD-10-CM | POA: Diagnosis not present

## 2019-02-21 DIAGNOSIS — E039 Hypothyroidism, unspecified: Secondary | ICD-10-CM | POA: Diagnosis not present

## 2019-02-21 DIAGNOSIS — Z79890 Hormone replacement therapy (postmenopausal): Secondary | ICD-10-CM

## 2019-02-21 DIAGNOSIS — G8929 Other chronic pain: Secondary | ICD-10-CM | POA: Diagnosis present

## 2019-02-21 DIAGNOSIS — Z79891 Long term (current) use of opiate analgesic: Secondary | ICD-10-CM

## 2019-02-21 DIAGNOSIS — M4310 Spondylolisthesis, site unspecified: Secondary | ICD-10-CM | POA: Diagnosis present

## 2019-02-21 DIAGNOSIS — Z981 Arthrodesis status: Secondary | ICD-10-CM | POA: Diagnosis not present

## 2019-02-21 DIAGNOSIS — M5116 Intervertebral disc disorders with radiculopathy, lumbar region: Secondary | ICD-10-CM | POA: Diagnosis present

## 2019-02-21 DIAGNOSIS — Z90710 Acquired absence of both cervix and uterus: Secondary | ICD-10-CM | POA: Diagnosis not present

## 2019-02-21 DIAGNOSIS — Z79899 Other long term (current) drug therapy: Secondary | ICD-10-CM | POA: Diagnosis not present

## 2019-02-21 DIAGNOSIS — M48061 Spinal stenosis, lumbar region without neurogenic claudication: Secondary | ICD-10-CM | POA: Diagnosis not present

## 2019-02-21 DIAGNOSIS — M4316 Spondylolisthesis, lumbar region: Secondary | ICD-10-CM | POA: Diagnosis not present

## 2019-02-21 DIAGNOSIS — M4326 Fusion of spine, lumbar region: Secondary | ICD-10-CM | POA: Diagnosis not present

## 2019-02-21 DIAGNOSIS — Z419 Encounter for procedure for purposes other than remedying health state, unspecified: Secondary | ICD-10-CM

## 2019-02-21 DIAGNOSIS — M5416 Radiculopathy, lumbar region: Secondary | ICD-10-CM | POA: Diagnosis not present

## 2019-02-21 HISTORY — PX: ANTERIOR LATERAL LUMBAR FUSION WITH PERCUTANEOUS SCREW 2 LEVEL: SHX5554

## 2019-02-21 SURGERY — ANTERIOR LATERAL LUMBAR FUSION WITH PERCUTANEOUS SCREW 2 LEVEL
Anesthesia: General | Site: Spine Lumbar | Laterality: Left

## 2019-02-21 MED ORDER — PHENYLEPHRINE HCL-NACL 10-0.9 MG/250ML-% IV SOLN
INTRAVENOUS | Status: AC
  Filled 2019-02-21: qty 250

## 2019-02-21 MED ORDER — SODIUM CHLORIDE 0.9 % IV SOLN: 250.0000 mL | INTRAVENOUS | Status: DC

## 2019-02-21 MED ORDER — LEVOTHYROXINE SODIUM 25 MCG PO TABS
25.0000 ug | ORAL_TABLET | Freq: Every day | ORAL | Status: DC
  Administered 2019-02-22: 25 ug via ORAL
  Filled 2019-02-21: qty 1

## 2019-02-21 MED ORDER — PROPOFOL 500 MG/50ML IV EMUL: INTRAVENOUS | Status: DC | PRN

## 2019-02-21 MED ORDER — FENTANYL CITRATE (PF) 250 MCG/5ML IJ SOLN
INTRAMUSCULAR | Status: AC
  Filled 2019-02-21: qty 5

## 2019-02-21 MED ORDER — BISACODYL 10 MG RE SUPP: 10.0000 mg | Freq: Every day | RECTAL | Status: DC | PRN

## 2019-02-21 MED ORDER — LISINOPRIL-HYDROCHLOROTHIAZIDE 20-25 MG PO TABS: 1.0000 | ORAL_TABLET | Freq: Every day | ORAL | Status: DC

## 2019-02-21 MED ORDER — DEXMEDETOMIDINE HCL 200 MCG/2ML IV SOLN
INTRAVENOUS | Status: DC | PRN
  Administered 2019-02-21: 8 ug via INTRAVENOUS

## 2019-02-21 MED ORDER — BUPIVACAINE HCL (PF) 0.25 % IJ SOLN
INTRAMUSCULAR | Status: DC | PRN
  Administered 2019-02-21: 21 mL
  Administered 2019-02-21: 9 mL

## 2019-02-21 MED ORDER — SODIUM CHLORIDE 0.9% FLUSH: 3.0000 mL | INTRAVENOUS | Status: DC | PRN

## 2019-02-21 MED ORDER — ONDANSETRON HCL 4 MG/2ML IJ SOLN: 4.0000 mg | Freq: Four times a day (QID) | INTRAMUSCULAR | Status: DC | PRN

## 2019-02-21 MED ORDER — DIAZEPAM 5 MG PO TABS
ORAL_TABLET | ORAL | Status: AC
  Administered 2019-02-21: 5 mg via ORAL
  Filled 2019-02-21: qty 1

## 2019-02-21 MED ORDER — CHLORHEXIDINE GLUCONATE CLOTH 2 % EX PADS: 6.0000 | MEDICATED_PAD | Freq: Once | CUTANEOUS | Status: DC

## 2019-02-21 MED ORDER — MENTHOL 3 MG MT LOZG: 1.0000 | LOZENGE | OROMUCOSAL | Status: DC | PRN

## 2019-02-21 MED ORDER — SODIUM CHLORIDE 0.9% FLUSH
3.0000 mL | Freq: Two times a day (BID) | INTRAVENOUS | Status: DC
  Administered 2019-02-21: 3 mL via INTRAVENOUS

## 2019-02-21 MED ORDER — PHENOL 1.4 % MT LIQD: 1.0000 | OROMUCOSAL | Status: DC | PRN

## 2019-02-21 MED ORDER — LISINOPRIL 20 MG PO TABS
20.0000 mg | ORAL_TABLET | Freq: Every day | ORAL | Status: DC
  Administered 2019-02-21 – 2019-02-22 (×2): 20 mg via ORAL
  Filled 2019-02-21 (×2): qty 1

## 2019-02-21 MED ORDER — CEFAZOLIN SODIUM-DEXTROSE 1-4 GM/50ML-% IV SOLN
1.0000 g | Freq: Three times a day (TID) | INTRAVENOUS | Status: AC
  Administered 2019-02-21 (×2): 1 g via INTRAVENOUS
  Filled 2019-02-21 (×2): qty 50

## 2019-02-21 MED ORDER — THROMBIN 5000 UNITS EX SOLR
CUTANEOUS | Status: AC
  Filled 2019-02-21: qty 10000

## 2019-02-21 MED ORDER — SODIUM CHLORIDE 0.9 % IV SOLN: INTRAVENOUS | Status: DC | PRN

## 2019-02-21 MED ORDER — FLEET ENEMA 7-19 GM/118ML RE ENEM: 1.0000 | ENEMA | Freq: Once | RECTAL | Status: DC | PRN

## 2019-02-21 MED ORDER — PROPOFOL 500 MG/50ML IV EMUL
INTRAVENOUS | Status: DC | PRN
  Administered 2019-02-21: 25 ug/kg/min via INTRAVENOUS

## 2019-02-21 MED ORDER — HYDROMORPHONE HCL 1 MG/ML IJ SOLN
1.0000 mg | INTRAMUSCULAR | Status: DC | PRN
  Administered 2019-02-21 – 2019-02-22 (×2): 1 mg via INTRAVENOUS
  Filled 2019-02-21 (×2): qty 1

## 2019-02-21 MED ORDER — HYDROMORPHONE HCL 1 MG/ML IJ SOLN
INTRAMUSCULAR | Status: AC
  Administered 2019-02-21: 0.5 mg via INTRAVENOUS
  Filled 2019-02-21: qty 1

## 2019-02-21 MED ORDER — OXYCODONE HCL 5 MG PO TABS
10.0000 mg | ORAL_TABLET | ORAL | Status: DC | PRN
  Administered 2019-02-21 – 2019-02-22 (×4): 10 mg via ORAL
  Filled 2019-02-21 (×4): qty 2

## 2019-02-21 MED ORDER — ONDANSETRON HCL 4 MG/2ML IJ SOLN
INTRAMUSCULAR | Status: DC | PRN
  Administered 2019-02-21: 4 mg via INTRAVENOUS

## 2019-02-21 MED ORDER — PHENYLEPHRINE HCL-NACL 10-0.9 MG/250ML-% IV SOLN
INTRAVENOUS | Status: DC | PRN
  Administered 2019-02-21: 50 ug/min via INTRAVENOUS

## 2019-02-21 MED ORDER — PHENYLEPHRINE 40 MCG/ML (10ML) SYRINGE FOR IV PUSH (FOR BLOOD PRESSURE SUPPORT)
PREFILLED_SYRINGE | INTRAVENOUS | Status: DC | PRN
  Administered 2019-02-21: 120 ug via INTRAVENOUS
  Administered 2019-02-21: 80 ug via INTRAVENOUS

## 2019-02-21 MED ORDER — SUCCINYLCHOLINE CHLORIDE 200 MG/10ML IV SOSY
PREFILLED_SYRINGE | INTRAVENOUS | Status: DC | PRN
  Administered 2019-02-21: 120 mg via INTRAVENOUS

## 2019-02-21 MED ORDER — ACETAMINOPHEN 500 MG PO TABS
ORAL_TABLET | ORAL | Status: AC
  Filled 2019-02-21: qty 1

## 2019-02-21 MED ORDER — BUPIVACAINE HCL (PF) 0.25 % IJ SOLN
INTRAMUSCULAR | Status: AC
  Filled 2019-02-21: qty 30

## 2019-02-21 MED ORDER — DEXAMETHASONE SODIUM PHOSPHATE 10 MG/ML IJ SOLN
INTRAMUSCULAR | Status: AC
  Filled 2019-02-21: qty 1

## 2019-02-21 MED ORDER — ACETAMINOPHEN 325 MG PO TABS
ORAL_TABLET | ORAL | Status: DC | PRN
  Administered 2019-02-21: 1000 mg via ORAL

## 2019-02-21 MED ORDER — FLUTICASONE PROPIONATE 50 MCG/ACT NA SUSP
2.0000 | Freq: Every day | NASAL | Status: DC | PRN
  Filled 2019-02-21: qty 16

## 2019-02-21 MED ORDER — OXYCODONE HCL 5 MG PO TABS: 5.0000 mg | ORAL_TABLET | Freq: Once | ORAL | Status: DC | PRN

## 2019-02-21 MED ORDER — DEXAMETHASONE SODIUM PHOSPHATE 10 MG/ML IJ SOLN
10.0000 mg | Freq: Once | INTRAMUSCULAR | Status: AC
  Administered 2019-02-21: 10 mg via INTRAVENOUS

## 2019-02-21 MED ORDER — ACETAMINOPHEN 650 MG RE SUPP: 650.0000 mg | RECTAL | Status: DC | PRN

## 2019-02-21 MED ORDER — FENTANYL CITRATE (PF) 250 MCG/5ML IJ SOLN
INTRAMUSCULAR | Status: DC | PRN
  Administered 2019-02-21 (×2): 50 ug via INTRAVENOUS
  Administered 2019-02-21: 100 ug via INTRAVENOUS

## 2019-02-21 MED ORDER — TRAZODONE HCL 150 MG PO TABS: 150.0000 mg | ORAL_TABLET | Freq: Every evening | ORAL | Status: DC | PRN

## 2019-02-21 MED ORDER — BACITRACIN ZINC 500 UNIT/GM EX OINT
TOPICAL_OINTMENT | CUTANEOUS | Status: AC
  Filled 2019-02-21: qty 28.35

## 2019-02-21 MED ORDER — HYDROCHLOROTHIAZIDE 25 MG PO TABS: 25.0000 mg | ORAL_TABLET | Freq: Every day | ORAL | Status: DC

## 2019-02-21 MED ORDER — DIAZEPAM 5 MG PO TABS: 5.0000 mg | ORAL_TABLET | Freq: Four times a day (QID) | ORAL | Status: DC | PRN

## 2019-02-21 MED ORDER — LIDOCAINE 2% (20 MG/ML) 5 ML SYRINGE
INTRAMUSCULAR | Status: DC | PRN
  Administered 2019-02-21: 60 mg via INTRAVENOUS
  Administered 2019-02-21: 40 mg via INTRAVENOUS

## 2019-02-21 MED ORDER — PROPOFOL 10 MG/ML IV BOLUS
INTRAVENOUS | Status: DC | PRN
  Administered 2019-02-21: 120 mg via INTRAVENOUS

## 2019-02-21 MED ORDER — ONDANSETRON HCL 4 MG/2ML IJ SOLN
INTRAMUSCULAR | Status: AC
  Filled 2019-02-21: qty 2

## 2019-02-21 MED ORDER — HYDROMORPHONE HCL 1 MG/ML IJ SOLN
0.2500 mg | INTRAMUSCULAR | Status: DC | PRN
  Administered 2019-02-21 (×2): 0.5 mg via INTRAVENOUS

## 2019-02-21 MED ORDER — HYDROCODONE-ACETAMINOPHEN 10-325 MG PO TABS
1.0000 | ORAL_TABLET | ORAL | Status: DC | PRN
  Administered 2019-02-21: 1 via ORAL
  Filled 2019-02-21: qty 1

## 2019-02-21 MED ORDER — CEFAZOLIN SODIUM-DEXTROSE 2-4 GM/100ML-% IV SOLN
INTRAVENOUS | Status: AC
  Filled 2019-02-21: qty 100

## 2019-02-21 MED ORDER — NAPROXEN 250 MG PO TABS
500.0000 mg | ORAL_TABLET | Freq: Two times a day (BID) | ORAL | Status: DC
  Administered 2019-02-21 – 2019-02-22 (×2): 500 mg via ORAL
  Filled 2019-02-21 (×2): qty 2

## 2019-02-21 MED ORDER — PROSIGHT PO TABS
1.0000 | ORAL_TABLET | Freq: Two times a day (BID) | ORAL | Status: DC
  Administered 2019-02-21 – 2019-02-22 (×2): 1 via ORAL
  Filled 2019-02-21 (×3): qty 1

## 2019-02-21 MED ORDER — GABAPENTIN 600 MG PO TABS
600.0000 mg | ORAL_TABLET | Freq: Three times a day (TID) | ORAL | Status: DC
  Administered 2019-02-21 – 2019-02-22 (×3): 600 mg via ORAL
  Filled 2019-02-21 (×3): qty 1

## 2019-02-21 MED ORDER — LIDOCAINE 2% (20 MG/ML) 5 ML SYRINGE
INTRAMUSCULAR | Status: AC
  Filled 2019-02-21: qty 5

## 2019-02-21 MED ORDER — THROMBIN 5000 UNITS EX SOLR
CUTANEOUS | Status: DC | PRN
  Administered 2019-02-21 (×2): 5000 [IU] via TOPICAL

## 2019-02-21 MED ORDER — PRAVASTATIN SODIUM 10 MG PO TABS
20.0000 mg | ORAL_TABLET | Freq: Every day | ORAL | Status: DC
  Administered 2019-02-21: 20 mg via ORAL
  Filled 2019-02-21: qty 2

## 2019-02-21 MED ORDER — ACETAMINOPHEN 325 MG PO TABS: 650.0000 mg | ORAL_TABLET | ORAL | Status: DC | PRN

## 2019-02-21 MED ORDER — OXYCODONE HCL 5 MG PO TABS
ORAL_TABLET | ORAL | Status: AC
  Administered 2019-02-21: 10 mg via ORAL
  Filled 2019-02-21: qty 2

## 2019-02-21 MED ORDER — ONDANSETRON HCL 4 MG PO TABS: 4.0000 mg | ORAL_TABLET | Freq: Four times a day (QID) | ORAL | Status: DC | PRN

## 2019-02-21 MED ORDER — MEPERIDINE HCL 25 MG/ML IJ SOLN: 6.2500 mg | INTRAMUSCULAR | Status: DC | PRN

## 2019-02-21 MED ORDER — HEMOSTATIC AGENTS (NO CHARGE) OPTIME
TOPICAL | Status: DC | PRN
  Administered 2019-02-21: 1 via TOPICAL

## 2019-02-21 MED ORDER — CEFAZOLIN SODIUM-DEXTROSE 2-4 GM/100ML-% IV SOLN
2.0000 g | INTRAVENOUS | Status: AC
  Administered 2019-02-21: 08:00:00 2 g via INTRAVENOUS

## 2019-02-21 MED ORDER — POLYETHYLENE GLYCOL 3350 17 G PO PACK: 17.0000 g | PACK | Freq: Every day | ORAL | Status: DC | PRN

## 2019-02-21 MED ORDER — OXYCODONE HCL 5 MG/5ML PO SOLN: 5.0000 mg | Freq: Once | ORAL | Status: DC | PRN

## 2019-02-21 MED ORDER — MELATONIN 3 MG PO TABS
9.0000 mg | ORAL_TABLET | Freq: Every day | ORAL | Status: DC
  Administered 2019-02-21: 9 mg via ORAL
  Filled 2019-02-21 (×2): qty 3

## 2019-02-21 MED ORDER — LACTATED RINGERS IV SOLN: INTRAVENOUS | Status: DC

## 2019-02-21 MED ORDER — 0.9 % SODIUM CHLORIDE (POUR BTL) OPTIME
TOPICAL | Status: DC | PRN
  Administered 2019-02-21: 1000 mL

## 2019-02-21 MED ORDER — PROPOFOL 10 MG/ML IV BOLUS
INTRAVENOUS | Status: AC
  Filled 2019-02-21: qty 20

## 2019-02-21 MED ORDER — PROMETHAZINE HCL 25 MG/ML IJ SOLN: 6.2500 mg | INTRAMUSCULAR | Status: DC | PRN

## 2019-02-21 MED ORDER — ESTRADIOL 1 MG PO TABS
0.5000 mg | ORAL_TABLET | Freq: Every day | ORAL | Status: DC
  Administered 2019-02-22: 0.5 mg via ORAL
  Filled 2019-02-21: qty 0.5

## 2019-02-21 SURGICAL SUPPLY — 64 items
BAG DECANTER FOR FLEXI CONT (MISCELLANEOUS) ×3 IMPLANT
BENZOIN TINCTURE PRP APPL 2/3 (GAUZE/BANDAGES/DRESSINGS) ×3 IMPLANT
BONE MATRIX OSTEOCEL PRO MED (Bone Implant) ×6 IMPLANT
CAGE MODULUS XL 10X18X45 - 10 (Cage) ×3 IMPLANT
CAGE MODULUS XL 10X18X50 - 10 (Cage) ×3 IMPLANT
CANISTER SUCT 3000ML PPV (MISCELLANEOUS) ×3 IMPLANT
CARTRIDGE OIL MAESTRO DRILL (MISCELLANEOUS) ×1 IMPLANT
CLOSURE WOUND 1/2 X4 (GAUZE/BANDAGES/DRESSINGS) ×1
DERMABOND ADVANCED (GAUZE/BANDAGES/DRESSINGS) ×2
DERMABOND ADVANCED .7 DNX12 (GAUZE/BANDAGES/DRESSINGS) ×1 IMPLANT
DIFFUSER DRILL AIR PNEUMATIC (MISCELLANEOUS) ×3 IMPLANT
DRAPE C-ARM 42X72 X-RAY (DRAPES) ×6 IMPLANT
DRAPE C-ARMOR (DRAPES) ×3 IMPLANT
DRAPE LAPAROTOMY (DRAPES) ×3 IMPLANT
DRSG OPSITE POSTOP 3X4 (GAUZE/BANDAGES/DRESSINGS) ×3 IMPLANT
DRSG OPSITE POSTOP 4X6 (GAUZE/BANDAGES/DRESSINGS) ×3 IMPLANT
DRSG OPSITE POSTOP 4X8 (GAUZE/BANDAGES/DRESSINGS) ×3 IMPLANT
DURAPREP 26ML APPLICATOR (WOUND CARE) ×3 IMPLANT
ELECT REM PT RETURN 9FT ADLT (ELECTROSURGICAL) ×3
ELECTRODE REM PT RTRN 9FT ADLT (ELECTROSURGICAL) ×1 IMPLANT
EVACUATOR 1/8 PVC DRAIN (DRAIN) IMPLANT
GAUZE 4X4 16PLY RFD (DISPOSABLE) IMPLANT
GAUZE SPONGE 4X4 12PLY STRL (GAUZE/BANDAGES/DRESSINGS) ×3 IMPLANT
GLOVE BIO SURGEON STRL SZ 6.5 (GLOVE) ×2 IMPLANT
GLOVE BIO SURGEONS STRL SZ 6.5 (GLOVE) ×1
GLOVE BIOGEL PI IND STRL 6.5 (GLOVE) ×1 IMPLANT
GLOVE BIOGEL PI IND STRL 7.0 (GLOVE) ×3 IMPLANT
GLOVE BIOGEL PI IND STRL 7.5 (GLOVE) ×2 IMPLANT
GLOVE BIOGEL PI INDICATOR 6.5 (GLOVE) ×2
GLOVE BIOGEL PI INDICATOR 7.0 (GLOVE) ×6
GLOVE BIOGEL PI INDICATOR 7.5 (GLOVE) ×4
GLOVE ECLIPSE 9.0 STRL (GLOVE) ×9 IMPLANT
GLOVE EXAM NITRILE XL STR (GLOVE) IMPLANT
GOWN STRL REUS W/ TWL LRG LVL3 (GOWN DISPOSABLE) ×1 IMPLANT
GOWN STRL REUS W/ TWL XL LVL3 (GOWN DISPOSABLE) ×3 IMPLANT
GOWN STRL REUS W/TWL 2XL LVL3 (GOWN DISPOSABLE) ×3 IMPLANT
GOWN STRL REUS W/TWL LRG LVL3 (GOWN DISPOSABLE) ×2
GOWN STRL REUS W/TWL XL LVL3 (GOWN DISPOSABLE) ×6
GUIDEWIRE NITINOL BEVEL TIP (WIRE) ×9 IMPLANT
KIT BASIN OR (CUSTOM PROCEDURE TRAY) ×3 IMPLANT
KIT DILATOR XLIF 5 (KITS) ×2 IMPLANT
KIT SURGICAL ACCESS MAXCESS 4 (KITS) ×3 IMPLANT
KIT TURNOVER KIT B (KITS) ×3 IMPLANT
KIT XLIF (KITS) ×1
MODULE NVM5 NEXT GEN EMG (NEEDLE) ×3 IMPLANT
NEEDLE HYPO 22GX1.5 SAFETY (NEEDLE) ×3 IMPLANT
NEEDLE I-PASS III (NEEDLE) ×3 IMPLANT
NS IRRIG 1000ML POUR BTL (IV SOLUTION) ×3 IMPLANT
OIL CARTRIDGE MAESTRO DRILL (MISCELLANEOUS) ×3
PACK LAMINECTOMY NEURO (CUSTOM PROCEDURE TRAY) ×3 IMPLANT
ROD RELINE MAS LORD 5.5X85MM (Rod) ×3 IMPLANT
SCREW LOCK RELINE 5.5 TULIP (Screw) ×9 IMPLANT
SCREW RELINE MAS POLY 5.5X45MM (Screw) ×9 IMPLANT
SPONGE LAP 4X18 RFD (DISPOSABLE) IMPLANT
SPONGE SURGIFOAM ABS GEL 12-7 (HEMOSTASIS) IMPLANT
SPONGE SURGIFOAM ABS GEL SZ50 (HEMOSTASIS) ×3 IMPLANT
STRIP CLOSURE SKIN 1/2X4 (GAUZE/BANDAGES/DRESSINGS) ×2 IMPLANT
SUT VIC AB 0 CT1 18XCR BRD8 (SUTURE) ×2 IMPLANT
SUT VIC AB 0 CT1 8-18 (SUTURE) ×4
SUT VIC AB 2-0 CT1 18 (SUTURE) ×6 IMPLANT
SUT VIC AB 3-0 SH 8-18 (SUTURE) ×9 IMPLANT
TOWEL GREEN STERILE (TOWEL DISPOSABLE) ×3 IMPLANT
TOWEL GREEN STERILE FF (TOWEL DISPOSABLE) ×3 IMPLANT
WATER STERILE IRR 1000ML POUR (IV SOLUTION) ×3 IMPLANT

## 2019-02-21 NOTE — Care Management (Signed)
TOC consult acknowledged to assist with any HH/DME needs. Awaiting PT/OT eval for DCP recommendations and will continue to follow.  Midge Minium MSN, RN, NCM-BC, ACM-RN [phone]

## 2019-02-21 NOTE — Anesthesia Postprocedure Evaluation (Signed)
Anesthesia Post Note  Patient: Jean Delacruz  Procedure(s) Performed: Anterior Lateral Fusion Lumbar Two-Three/Lumbar Three-Four with unilateral percutaneous pedicle screws (Left Spine Lumbar)     Patient location during evaluation: PACU Anesthesia Type: General Level of consciousness: awake and alert Pain management: pain level controlled Vital Signs Assessment: post-procedure vital signs reviewed and stable Respiratory status: spontaneous breathing, nonlabored ventilation and respiratory function stable Cardiovascular status: blood pressure returned to baseline and stable Postop Assessment: no apparent nausea or vomiting Anesthetic complications: no    Last Vitals:  Vitals:   02/21/19 1145 02/21/19 1223  BP: (!) 146/72 (!) 148/83  Pulse: 93 92  Resp: 18 20  Temp:  36.4 C  SpO2: 100%     Last Pain:  Vitals:   02/21/19 1145  TempSrc:   PainSc: 3                  Lynda Rainwater

## 2019-02-21 NOTE — Op Note (Signed)
Date of procedure: 02/21/2019  Date of dictation: Same  Service: Neurosurgery  Preoperative diagnosis: L2-3, L3-4 degenerative spondylolisthesis with stenosis  Postoperative diagnosis: Same  Procedure Name: Left-sided L2-3 and L3-4 anterior lateral retroperitoneal interbody decompression and fusion utilizing interbody cage and morselized allograft  Posterior percutaneous pedicle screw fixation L2-3 and 4  Surgeon:Kalem Rockwell A.Ameriah Lint, M.D.  Asst. Surgeon: Milas Gain, NP  Anesthesia: General  Indication: 69 year old female with chronic and progressive lower back pain with elements of radiculopathy which is failed conservative management.  Work-up demonstrates evidence of marked degenerative disc disease at L2-3 and L3-4 with significant degenerative retrolisthesis and marked foraminal stenosis at L2-3 and anterior listhesis with foraminal and lateral recess stenosis at L3-4.  Patient presents now for two-level decompression and fusion in hopes of improving her symptoms.  Operative note: After induction anesthesia, patient position in the right lateral decubitus position and appropriately padded.  Patient's left flank was prepped and draped sterilely as was her lumbar region.  Incision was made overlying the vertebral body of L3 in her left flank and a posterior flank incision was made for access into the retroperitoneal space.  Using blunt dissection through the posterior incision access was gained into the retroperitoneal space.  Peritoneal sac and contents were swept anteriorly.  Revisiting the lateral incision a dilator was then passed through the abdominal wall docking into the lateral spine at the L2-3 level.  Good position was confirmed by fluoroscopy.  Neural stimulation was performed in the lumbar plexus was found to be safely away from the planned entry site.  A guidewire was then passed into the disc space.  The dilators were sequentially increased and a self-retaining retractor was  placed.  The retractor was stimulated and found to be free of adjacent lumbar plexus structures.  The shim was then passed into the disc space.  The distractor was opened and the guidewire and prior dilators were removed.  This patient is examined.  There is no evidence of any adjacent neural structures.  The spaces incised.  Discectomy then performed using pituitary rongeurs and various curettes.  Contralateral release was performed using the Cobb elevators.  The space was sized and a lordotic 10 mm x 18 x 45 mm implant was found to be most appropriate.  The space was cleaned further of soft tissue.  The cage was packed with osteocell plus and impacted in the place.  It was confirmed to be in good position both the AP and lateral planes.  Retractor was removed.  The L3-4 disc space was docked in a similar fashion again using neural stimulation throughout.  Once again a 10 mm lordotic by 18 x 50 mm implant packed with osteocell plus was impacted into place.  Good position was confirmed in AP and lateral views.  Attention then placed to the lumbar region.  The bed was returned to a neutral position.  Stab incisions were made overlying the pedicles at L2-L3 and L4.  Jamshidi needle introducers were then passed into the pedicles under fluoroscopic guidance with intraoperative neural stimulation.  Guidewires were placed.  Each pedicle was then tapped with a screw tap again using neural stimulation and 5.5 mm Nuvasive screws were placed on the left side at L2-L3 and L4.  A rod was then passed through the screw towers and secured into place with locking caps.  Locking caps were then given a final tightening.  The screw towers were removed.  Final images reveal good position of the cages and  the hardware at the proper operative level with normal alignment of the spine.  Wounds were then irrigated with antibiotic solution.  Wounds were then closed in layers with Vicryl sutures.  Steri-Strips and sterile dressing were  applied.  No apparent complications.  Patient tolerated the procedure well and she returns to the recovery room postop.

## 2019-02-21 NOTE — Transfer of Care (Signed)
Immediate Anesthesia Transfer of Care Note  Patient: Jean Delacruz  Procedure(s) Performed: Anterior Lateral Fusion Lumbar Two-Three/Lumbar Three-Four with unilateral percutaneous pedicle screws (Left Spine Lumbar)  Patient Location: PACU  Anesthesia Type:General  Level of Consciousness: drowsy  Airway & Oxygen Therapy: Patient Spontanous Breathing and Patient connected to face mask oxygen  Post-op Assessment: Report given to RN and Post -op Vital signs reviewed and stable  Post vital signs: Reviewed and stable  Last Vitals:  Vitals Value Taken Time  BP 148/83 02/21/19 1045  Temp    Pulse 78 02/21/19 1048  Resp 28 02/21/19 1048  SpO2 99 % 02/21/19 1048  Vitals shown include unvalidated device data.  Last Pain:  Vitals:   02/21/19 0637  TempSrc: Oral         Complications: No apparent anesthesia complications

## 2019-02-21 NOTE — H&P (Signed)
Jean Delacruz is an 69 y.o. female.   Chief Complaint: Back pain HPI: 69 year old female with chronic and progressively worsening lower back pain with intermittent radiation to her lower extremities.  Patient is failed conservative management.  Work-up demonstrates evidence of marked disc degeneration with degenerative retrolisthesis and stenosis at L2-3 and anterolisthesis and stenosis at L3-4.  Patient presents now for L2-3 and L3-4 decompression and fusion in hopes of improving her symptoms.  Past Medical History:  Diagnosis Date  . Arthritis   . Chronic pain   . Hypertension   . Hypothyroidism   . PONV (postoperative nausea and vomiting)     Past Surgical History:  Procedure Laterality Date  . ABDOMINAL HYSTERECTOMY    . BLADDER SURGERY  2007  . CESAREAN SECTION     x2  . CYST EXCISION     scalp  . galion cyst left hand x2    . NASAL SINUS SURGERY    . TONSILLECTOMY    . TUBAL LIGATION      History reviewed. No pertinent family history. Social History:  reports that she has never smoked. She has never used smokeless tobacco. She reports that she does not drink alcohol or use drugs.  Allergies:  Allergies  Allergen Reactions  . Pregabalin Other (See Comments) and Swelling    Swelling lips     Medications Prior to Admission  Medication Sig Dispense Refill  . estradiol (ESTRACE) 0.5 MG tablet Take 0.5 mg by mouth daily.     . fluticasone (FLONASE) 50 MCG/ACT nasal spray Place 2 sprays into both nostrils daily as needed for allergies or rhinitis.    Marland Kitchen gabapentin (NEURONTIN) 600 MG tablet Take 600 mg by mouth 3 (three) times daily.   3  . levothyroxine (SYNTHROID, LEVOTHROID) 25 MCG tablet Take 25 mcg by mouth daily before breakfast.     . lisinopril-hydrochlorothiazide (ZESTORETIC) 20-25 MG tablet Take 1 tablet by mouth daily.     . Melatonin 10 MG TABS Take 10 mg by mouth daily.    . Multiple Vitamins-Minerals (PRESERVISION AREDS 2) CAPS Take 1 capsule by mouth 2  (two) times daily.    . naproxen (NAPROSYN) 500 MG tablet Take 500 mg by mouth 2 (two) times daily with a meal.   12  . pravastatin (PRAVACHOL) 20 MG tablet Take 20 mg by mouth at bedtime.   4  . traMADol (ULTRAM) 50 MG tablet Take 50 mg by mouth every 6 (six) hours as needed for moderate pain.     . traZODone (DESYREL) 150 MG tablet Take 150 mg by mouth at bedtime as needed for sleep.    . methylPREDNISolone (MEDROL DOSEPAK) 4 MG TBPK tablet Take as instructed (Patient not taking: Reported on 02/04/2019) 21 tablet 0    No results found for this or any previous visit (from the past 48 hour(s)). No results found.  Pertinent items noted in HPI and remainder of comprehensive ROS otherwise negative.  Blood pressure 136/71, pulse 74, temperature 98.4 F (36.9 C), temperature source Oral, resp. rate 17, height 5\' 2"  (1.575 m), weight 59 kg, SpO2 97 %.  Patient is awake and alert.  She is oriented and appropriate.  Speech is fluent.  Judgment insight are intact.  Cranial nerve function normal bilateral.  Motor examination extremities reveals intact motor strength bilateral.  Sensory examination with some mild decrease sensation pinprick light touch in her L2 dermatomes left more so than right.  Deep tender flexes are hypoactive but symmetric.  No evidence of long track signs.  Gait is antalgic.  Posture is mildly flexed peer examination head ears eyes nose and throat is unremarkable.  Chest and abdomen are benign.  Extremities are free from injury or deformity. Assessment/Plan L2-3, L3-4 degenerative spondylolisthesis with stenosis and severe back pain.  Plan left-sided L2-3 and L3-4 anterior lateral retroperitoneal interbody decompression and fusion utilizing interbody cages and allograft with posterior percutaneous pedicle screw fixation.  Risks and benefits of been explained.  Patient wishes to proceed.  Mallie Mussel A Elior Robinette 02/21/2019, 7:51 AM

## 2019-02-21 NOTE — Brief Op Note (Signed)
02/21/2019  10:23 AM  PATIENT:  Jean Delacruz  69 y.o. female  PRE-OPERATIVE DIAGNOSIS:  Spondylolisthesis  POST-OPERATIVE DIAGNOSIS:  * No post-op diagnosis entered *  PROCEDURE:  Procedure(s): XLIF - Interbody Fusion - L2-L3 - L3-L4with perc pedicle screws (Left)  SURGEON:  Surgeon(s) and Role:    * Earnie Larsson, MD - Primary    Kristeen Miss, MD - Assisting  PHYSICIAN ASSISTANT:   ASSISTANTSMearl Latin   ANESTHESIA:   general  EBL:  50 mL   BLOOD ADMINISTERED:none  DRAINS: none   LOCAL MEDICATIONS USED:  MARCAINE     SPECIMEN:  No Specimen  DISPOSITION OF SPECIMEN:  N/A  COUNTS:  YES  TOURNIQUET:  * No tourniquets in log *  DICTATION: .Dragon Dictation  PLAN OF CARE: Admit to inpatient   PATIENT DISPOSITION:  PACU - hemodynamically stable.   Delay start of Pharmacological VTE agent (>24hrs) due to surgical blood loss or risk of bleeding: yes

## 2019-02-21 NOTE — Anesthesia Procedure Notes (Signed)
Procedure Name: Intubation Performed by: Milford Cage, CRNA Pre-anesthesia Checklist: Patient identified, Emergency Drugs available, Suction available and Patient being monitored Patient Re-evaluated:Patient Re-evaluated prior to induction Oxygen Delivery Method: Circle System Utilized Preoxygenation: Pre-oxygenation with 100% oxygen Induction Type: IV induction Ventilation: Mask ventilation without difficulty Laryngoscope Size: Mac and 3 Grade View: Grade I Tube type: Oral Tube size: 7.0 mm Number of attempts: 1 Airway Equipment and Method: Stylet and Oral airway Placement Confirmation: ETT inserted through vocal cords under direct vision,  positive ETCO2 and breath sounds checked- equal and bilateral Secured at: 22 cm Tube secured with: Tape Dental Injury: Teeth and Oropharynx as per pre-operative assessment

## 2019-02-21 NOTE — Anesthesia Preprocedure Evaluation (Signed)
Anesthesia Evaluation  Patient identified by MRN, date of birth, ID band Patient awake    Reviewed: Allergy & Precautions, NPO status , Patient's Chart, lab work & pertinent test results  History of Anesthesia Complications (+) PONV  Airway Mallampati: II  TM Distance: >3 FB Neck ROM: Full    Dental no notable dental hx.    Pulmonary neg pulmonary ROS,    Pulmonary exam normal breath sounds clear to auscultation       Cardiovascular hypertension, Pt. on medications negative cardio ROS Normal cardiovascular exam Rhythm:Regular Rate:Normal     Neuro/Psych negative neurological ROS  negative psych ROS   GI/Hepatic negative GI ROS, Neg liver ROS,   Endo/Other  Hypothyroidism   Renal/GU negative Renal ROS  negative genitourinary   Musculoskeletal  (+) Arthritis , Osteoarthritis,    Abdominal   Peds negative pediatric ROS (+)  Hematology negative hematology ROS (+)   Anesthesia Other Findings   Reproductive/Obstetrics negative OB ROS                             Anesthesia Physical Anesthesia Plan  ASA: II  Anesthesia Plan: General   Post-op Pain Management:    Induction: Intravenous  PONV Risk Score and Plan: 4 or greater and Ondansetron, Dexamethasone, Midazolam, Scopolamine patch - Pre-op and Treatment may vary due to age or medical condition  Airway Management Planned: Oral ETT  Additional Equipment:   Intra-op Plan:   Post-operative Plan: Extubation in OR  Informed Consent: I have reviewed the patients History and Physical, chart, labs and discussed the procedure including the risks, benefits and alternatives for the proposed anesthesia with the patient or authorized representative who has indicated his/her understanding and acceptance.     Dental advisory given  Plan Discussed with: CRNA  Anesthesia Plan Comments:         Anesthesia Quick Evaluation

## 2019-02-22 ENCOUNTER — Encounter: Payer: Self-pay | Admitting: *Deleted

## 2019-02-22 MED ORDER — OXYCODONE HCL 10 MG PO TABS: 5.0000 mg | ORAL_TABLET | ORAL | 0 refills | Status: DC | PRN

## 2019-02-22 MED ORDER — DIAZEPAM 5 MG PO TABS: 5.0000 mg | ORAL_TABLET | Freq: Four times a day (QID) | ORAL | 0 refills | Status: DC | PRN

## 2019-02-22 NOTE — Evaluation (Signed)
Occupational Therapy Evaluation Patient Details Name: Jean Delacruz MRN: [id number] DOB: 03-12-1950 Today's Date: 02/22/2019    History of Present Illness 69 yo female s/p PLIF L2-3 L3-4 with unilateral percutaneous pedicule screws PMH Arthritis, HTN, Hypothyroidism PONV    Clinical Impression   This 69 y/o female presents with the above. PTA pt reports independence with ADL and mobility. Pt demonstrating functional mobility without AD overall at supervision-minguard assist level. She is currently mod independent with seated UB ADL, requiring minA for LB ADL. Educated pt re: back precautions, brace management, safety and compensatory techniques for completing ADL and functional transfers with pt verbalizing and return demonstrating understanding, min cues provided throughout session for carryover of precautions. Pt reports plans to return home with spouse who is able to assist with ADL/iADL PRN. All questions answered with no further acute OT needs identified at this time. Acute OT to sign off, thank you for this referral.     Follow Up Recommendations  No OT follow up;Supervision/Assistance - 24 hour(24hr initially)    Equipment Recommendations  3 in 1 bedside commode           Precautions / Restrictions Precautions Precautions: Fall;Back Precaution Booklet Issued: Yes (comment) Precaution Comments: Pt was cued for precautions throughout functional mobility.  Required Braces or Orthoses: Spinal Brace Spinal Brace: Lumbar corset;Applied in sitting position Restrictions Weight Bearing Restrictions: No      Mobility Bed Mobility Overal bed mobility: Needs Assistance Bed Mobility: Rolling;Sit to Sidelying Rolling: Supervision       Sit to sidelying: Min assist General bed mobility comments: light assist for LEs onto EOB, VCs for log roll technique, performed from flat bed with bedrail down  Transfers Overall transfer level: Needs assistance Equipment used:  None Transfers: Sit to/from Stand Sit to Stand: Supervision         General transfer comment: for safety and balance    Balance Overall balance assessment: Needs assistance Sitting-balance support: Feet supported Sitting balance-Leahy Scale: Good     Standing balance support: No upper extremity supported;During functional activity Standing balance-Leahy Scale: Fair                             ADL either performed or assessed with clinical judgement   ADL Overall ADL's : Needs assistance/impaired Eating/Feeding: Independent;Sitting   Grooming: Supervision/safety;Standing   Upper Body Bathing: Supervision/ safety;Sitting   Lower Body Bathing: Min guard;Sit to/from stand   Upper Body Dressing : Set up;Min guard;Sitting Upper Body Dressing Details (indicate cue type and reason): donning bra, overhead shirt and lumbar brace, min cues for technique with brace Lower Body Dressing: Minimal assistance;Sit to/from stand Lower Body Dressing Details (indicate cue type and reason): pt able to perform modified figure 4, some assist for donning pantlegs over LEs, minguard in standing for balance while advancing over hips  Toilet Transfer: Supervision/safety;Ambulation Toilet Transfer Details (indicate cue type and reason): simulated via transfer to/from EOB Toileting- Clothing Manipulation and Hygiene: Min guard;Sit to/from Nurse, children's Details (indicate cue type and reason): reviewed safe transfer techniques; educated on option of 3:1 as shower seat for increased safety and increased independence with task completion, pt in agreement  Functional mobility during ADLs: Supervision/safety;Min guard       Vision         Perception     Praxis      Pertinent Vitals/Pain Pain Assessment: Faces Faces Pain Scale: Hurts a little  bit Pain Location: incisional Pain Descriptors / Indicators: Discomfort;Sore Pain Intervention(s): Limited activity within  patient's tolerance;Monitored during session;Repositioned     Hand Dominance     Extremity/Trunk Assessment Upper Extremity Assessment Upper Extremity Assessment: Overall WFL for tasks assessed   Lower Extremity Assessment Lower Extremity Assessment: Defer to PT evaluation   Cervical / Trunk Assessment Cervical / Trunk Assessment: Other exceptions Cervical / Trunk Exceptions: s/p spinal sx   Communication Communication Communication: No difficulties   Cognition Arousal/Alertness: Awake/alert Behavior During Therapy: WFL for tasks assessed/performed;Anxious Overall Cognitive Status: Within Functional Limits for tasks assessed                                     General Comments       Exercises     Shoulder Instructions      Home Living Family/patient expects to be discharged to:: Private residence Living Arrangements: Spouse/significant other Available Help at Discharge: Family;Available 24 hours/day Type of Home: House       Home Layout: Two level     Bathroom Shower/Tub: Teacher, early years/pre: Standard     Home Equipment: None          Prior Functioning/Environment Level of Independence: Independent                 OT Problem List: Decreased strength;Decreased activity tolerance;Impaired balance (sitting and/or standing);Decreased knowledge of use of DME or AE;Decreased knowledge of precautions;Pain      OT Treatment/Interventions:      OT Goals(Current goals can be found in the care plan section) Acute Rehab OT Goals Patient Stated Goal: home when able OT Goal Formulation: All assessment and education complete, DC therapy  OT Frequency:     Barriers to D/C:            Co-evaluation              AM-PAC OT "6 Clicks" Daily Activity     Outcome Measure Help from another person eating meals?: None Help from another person taking care of personal grooming?: None Help from another person toileting, which  includes using toliet, bedpan, or urinal?: A Little Help from another person bathing (including washing, rinsing, drying)?: A Little Help from another person to put on and taking off regular upper body clothing?: None Help from another person to put on and taking off regular lower body clothing?: A Little 6 Click Score: 21   End of Session Equipment Utilized During Treatment: Back brace Nurse Communication: Mobility status  Activity Tolerance: Patient tolerated treatment well Patient left: in bed;with call bell/phone within reach  OT Visit Diagnosis: Other abnormalities of gait and mobility (R26.89);Pain Pain - part of body: (back)                Time: [id number] OT Time Calculation (min): 31 min Charges:  OT General Charges $OT Visit: 1 Visit OT Evaluation $OT Eval Low Complexity: 1 Low OT Treatments $Self Care/Home Management : 8-22 mins  Lou Cal, OT Supplemental Rehabilitation Services Pager [phone] Office [place] 02/22/2019, 12:19 PM

## 2019-02-22 NOTE — Discharge Instructions (Signed)

## 2019-02-22 NOTE — Evaluation (Signed)
Physical Therapy Evaluation Patient Details Name: Jean Delacruz MRN: [id number] DOB: [birth year] Today's Date: 02/22/2019   History of Present Illness  69 yo female s/p PLIF L2-3 L3-4 with unilateral percutaneous pedicule screws PMH HTN, Hypothyroidism  Clinical Impression  Pt admitted with above diagnosis. At the time of PT eval, pt was able to demonstrate transfers and ambulation with gross supervision for safety and stair training with min guard assist - no AD throughout session. Pt was educated on precautions, brace application/wearing schedule, appropriate activity progression, and car transfer. Pt currently with functional limitations due to the deficits listed below (see PT Problem List). Pt will benefit from skilled PT to increase their independence and safety with mobility to allow discharge to the venue listed below.      Follow Up Recommendations No PT follow up;Supervision for mobility/OOB    Equipment Recommendations  None recommended by PT    Recommendations for Other Services       Precautions / Restrictions Precautions Precautions: Fall;Back Precaution Booklet Issued: Yes (comment) Precaution Comments: Pt was cued for precautions throughout functional mobility.  Required Braces or Orthoses: Spinal Brace Spinal Brace: Lumbar corset;Applied in sitting position Restrictions Weight Bearing Restrictions: No      Mobility  Bed Mobility Overal bed mobility: Needs Assistance Bed Mobility: Rolling;Sidelying to Sit Rolling: Supervision Sidelying to sit: Supervision     Sit to sidelying: Min assist General bed mobility comments: VC's for log roll technique. Increased time required.   Transfers Overall transfer level: Needs assistance Equipment used: None Transfers: Sit to/from Stand Sit to Stand: Supervision         General transfer comment: for safety and balance  Ambulation/Gait Ambulation/Gait assistance: Supervision Gait Distance (Feet): 250  Feet Assistive device: None Gait Pattern/deviations: Step-through pattern;Decreased stride length;Trunk flexed;Narrow base of support Gait velocity: Decreased Gait velocity interpretation: <1.8 ft/sec, indicate of risk for recurrent falls General Gait Details: Slow and guarded - close supervision provided for safety but no assistance required.  Stairs Stairs: Yes Stairs assistance: Min guard Stair Management: Two rails;Step to pattern;Forwards Number of Stairs: 10 General stair comments: VC's for sequencing and general safety. No assistance required.   Wheelchair Mobility    Modified Rankin (Stroke Patients Only)       Balance Overall balance assessment: Needs assistance Sitting-balance support: Feet supported Sitting balance-Leahy Scale: Good     Standing balance support: No upper extremity supported;During functional activity Standing balance-Leahy Scale: Fair                               Pertinent Vitals/Pain Pain Assessment: Faces Faces Pain Scale: Hurts a little bit Pain Location: incisional Pain Descriptors / Indicators: Discomfort;Sore Pain Intervention(s): Limited activity within patient's tolerance;Monitored during session;Repositioned    Home Living Family/patient expects to be discharged to:: Private residence Living Arrangements: Spouse/significant other Available Help at Discharge: Family;Available 24 hours/day Type of Home: House Home Access: Stairs to enter   [employer] of Steps: 1 Home Layout: Two level Home Equipment: None      Prior Function Level of Independence: Independent               Hand Dominance        Extremity/Trunk Assessment   Upper Extremity Assessment Upper Extremity Assessment: Defer to OT evaluation    Lower Extremity Assessment Lower Extremity Assessment: Generalized weakness    Cervical / Trunk Assessment Cervical / Trunk Assessment: Other exceptions Cervical / Trunk  Exceptions:  s/p spinal sx  Communication   Communication: No difficulties  Cognition Arousal/Alertness: Awake/alert Behavior During Therapy: WFL for tasks assessed/performed;Anxious Overall Cognitive Status: Within Functional Limits for tasks assessed                                        General Comments      Exercises     Assessment/Plan    PT Assessment Patient needs continued PT services  PT Problem List Decreased strength;Decreased activity tolerance;Decreased balance;Decreased mobility;Decreased knowledge of use of DME;Decreased safety awareness;Decreased knowledge of precautions;Pain       PT Treatment Interventions DME instruction;Gait training;Functional mobility training;Stair training;Therapeutic activities;Therapeutic exercise;Neuromuscular re-education;Patient/family education    PT Goals (Current goals can be found in the Care Plan section)  Acute Rehab PT Goals Patient Stated Goal: home when able PT Goal Formulation: With patient Time For Goal Achievement: 03/01/19 Potential to Achieve Goals: Good    Frequency Min 5X/week   Barriers to discharge        Co-evaluation               AM-PAC PT "6 Clicks" Mobility  Outcome Measure Help needed turning from your back to your side while in a flat bed without using bedrails?: None Help needed moving from lying on your back to sitting on the side of a flat bed without using bedrails?: A Little Help needed moving to and from a bed to a chair (including a wheelchair)?: A Little Help needed standing up from a chair using your arms (e.g., wheelchair or bedside chair)?: A Little Help needed to walk in hospital room?: A Little Help needed climbing 3-5 steps with a railing? : A Little 6 Click Score: 19    End of Session Equipment Utilized During Treatment: Gait belt Activity Tolerance: Patient tolerated treatment well Patient left: Other (comment)(Sitting EOB with OT present) Nurse Communication: Mobility  status PT Visit Diagnosis: Unsteadiness on feet (R26.81);Pain Pain - part of body: (back)    Time: [id number] PT Time Calculation (min) (ACUTE ONLY): 32 min   Charges:   PT Evaluation $PT Eval Moderate Complexity: 1 Mod PT Treatments $Gait Training: 8-22 mins        Rolinda Roan, PT, DPT Acute Rehabilitation Services Pager: [phone] Office: [phone]   Thelma Comp 02/22/2019, 12:37 PM

## 2019-02-22 NOTE — Plan of Care (Signed)
Pt and husband given D/C instructions with verbal understanding. Rx's were sent to pharmacy by MD . Pt's incision is clean and dry with no sign of infection. Pt's IV was removed prior to D/C. 3-n-1 was given to Pt per MD order. Pt D/C'd home via wheelchair per MD order. Pt is stable @ D/C and has no other needs at this time. Holli Humbles, RN

## 2019-02-22 NOTE — Discharge Summary (Signed)
Physician Discharge Summary  Patient ID: Jean Delacruz MRN: [id number] DOB/AGE: 1950-06-16 69 y.o.  Admit date: 02/21/2019 Discharge date: 02/22/2019  Admission Diagnoses:  Discharge Diagnoses:  Active Problems:   Degenerative spondylolisthesis   Discharged Condition: good  Hospital Course: Patient admitted to the hospital where she underwent uncomplicated two-level lumbar decompression and fusion surgery.  Postoperatively doing well.  Preoperative pain improved.  Standing and ambulating and voiding without difficulty.  Wounds clean and dry.  Patient is ready for discharge.  Consults:   Significant Diagnostic Studies:   Treatments:   Discharge Exam: Blood pressure 121/84, pulse 83, temperature 97.9 F (36.6 C), temperature source Oral, resp. rate 16, height 5\' 2"  (1.575 m), weight 59 kg, SpO2 98 %. Awake and alert.  Oriented and appropriate.  Motor and sensory function intact.  Abdomen soft.  Chest benign.  Wounds clean and dry.  Disposition: Discharge disposition: 01-Home or Self Care        Allergies as of 02/22/2019      Reactions   Pregabalin Other (See Comments), Swelling   Swelling lips       Medication List    STOP taking these medications   traMADol 50 MG tablet Commonly known as: ULTRAM     TAKE these medications   diazepam 5 MG tablet Commonly known as: VALIUM Take 1-2 tablets (5-10 mg total) by mouth every 6 (six) hours as needed for muscle spasms.   estradiol 0.5 MG tablet Commonly known as: ESTRACE Take 0.5 mg by mouth daily.   fluticasone 50 MCG/ACT nasal spray Commonly known as: FLONASE Place 2 sprays into both nostrils daily as needed for allergies or rhinitis.   gabapentin 600 MG tablet Commonly known as: NEURONTIN Take 600 mg by mouth 3 (three) times daily.   levothyroxine 25 MCG tablet Commonly known as: SYNTHROID Take 25 mcg by mouth daily before breakfast.   lisinopril-hydrochlorothiazide 20-25 MG tablet Commonly known as:  ZESTORETIC Take 1 tablet by mouth daily.   Melatonin 10 MG Tabs Take 10 mg by mouth daily.   methylPREDNISolone 4 MG Tbpk tablet Commonly known as: MEDROL DOSEPAK Take as instructed   naproxen 500 MG tablet Commonly known as: NAPROSYN Take 500 mg by mouth 2 (two) times daily with a meal.   Oxycodone HCl 10 MG Tabs Take 0.5-1 tablets (5-10 mg total) by mouth every 3 (three) hours as needed for severe pain ((score 7 to 10)).   pravastatin 20 MG tablet Commonly known as: PRAVACHOL Take 20 mg by mouth at bedtime.   PreserVision AREDS 2 Caps Take 1 capsule by mouth 2 (two) times daily.   traZODone 150 MG tablet Commonly known as: DESYREL Take 150 mg by mouth at bedtime as needed for sleep.            Durable Medical Equipment  (From admission, onward)         Start     Ordered   02/21/19 1219  DME Walker rolling  Once    Question:  Patient needs a walker to treat with the following condition  Answer:  Degenerative spondylolisthesis   02/21/19 1218   02/21/19 1219  DME 3 n 1  Once     02/21/19 1218           Signed: Mallie Mussel A Takima [place] 02/22/2019, 10:47 AM

## 2019-03-23 DIAGNOSIS — N390 Urinary tract infection, site not specified: Secondary | ICD-10-CM | POA: Diagnosis not present

## 2019-03-23 DIAGNOSIS — J019 Acute sinusitis, unspecified: Secondary | ICD-10-CM | POA: Diagnosis not present

## 2019-03-23 DIAGNOSIS — B9689 Other specified bacterial agents as the cause of diseases classified elsewhere: Secondary | ICD-10-CM | POA: Diagnosis not present

## 2019-03-24 DIAGNOSIS — M4310 Spondylolisthesis, site unspecified: Secondary | ICD-10-CM | POA: Diagnosis not present

## 2019-04-21 DIAGNOSIS — M4310 Spondylolisthesis, site unspecified: Secondary | ICD-10-CM | POA: Diagnosis not present

## 2019-05-09 DIAGNOSIS — Z1231 Encounter for screening mammogram for malignant neoplasm of breast: Secondary | ICD-10-CM | POA: Diagnosis not present

## 2019-05-09 DIAGNOSIS — E785 Hyperlipidemia, unspecified: Secondary | ICD-10-CM | POA: Diagnosis not present

## 2019-05-09 DIAGNOSIS — Z0000 Encounter for general adult medical examination without abnormal findings: Secondary | ICD-10-CM | POA: Diagnosis not present

## 2019-05-09 DIAGNOSIS — Z1211 Encounter for screening for malignant neoplasm of colon: Secondary | ICD-10-CM | POA: Diagnosis not present

## 2019-05-09 DIAGNOSIS — Z9181 History of falling: Secondary | ICD-10-CM | POA: Diagnosis not present

## 2019-05-09 DIAGNOSIS — Z1331 Encounter for screening for depression: Secondary | ICD-10-CM | POA: Diagnosis not present

## 2019-06-02 DIAGNOSIS — M4310 Spondylolisthesis, site unspecified: Secondary | ICD-10-CM | POA: Diagnosis not present

## 2019-06-16 DIAGNOSIS — M48061 Spinal stenosis, lumbar region without neurogenic claudication: Secondary | ICD-10-CM | POA: Diagnosis not present

## 2019-06-16 DIAGNOSIS — M4310 Spondylolisthesis, site unspecified: Secondary | ICD-10-CM | POA: Diagnosis not present

## 2019-06-16 DIAGNOSIS — M5126 Other intervertebral disc displacement, lumbar region: Secondary | ICD-10-CM | POA: Diagnosis not present

## 2019-06-21 DIAGNOSIS — M545 Low back pain: Secondary | ICD-10-CM | POA: Diagnosis not present

## 2019-06-21 DIAGNOSIS — G4700 Insomnia, unspecified: Secondary | ICD-10-CM | POA: Diagnosis not present

## 2019-06-21 DIAGNOSIS — I10 Essential (primary) hypertension: Secondary | ICD-10-CM | POA: Diagnosis not present

## 2019-06-21 DIAGNOSIS — G8929 Other chronic pain: Secondary | ICD-10-CM | POA: Diagnosis not present

## 2019-06-21 DIAGNOSIS — F411 Generalized anxiety disorder: Secondary | ICD-10-CM | POA: Diagnosis not present

## 2019-06-21 DIAGNOSIS — R300 Dysuria: Secondary | ICD-10-CM | POA: Diagnosis not present

## 2019-06-21 DIAGNOSIS — N390 Urinary tract infection, site not specified: Secondary | ICD-10-CM | POA: Diagnosis not present

## 2019-06-21 DIAGNOSIS — E785 Hyperlipidemia, unspecified: Secondary | ICD-10-CM | POA: Diagnosis not present

## 2019-06-21 DIAGNOSIS — K5909 Other constipation: Secondary | ICD-10-CM | POA: Diagnosis not present

## 2019-06-21 DIAGNOSIS — F3341 Major depressive disorder, recurrent, in partial remission: Secondary | ICD-10-CM | POA: Diagnosis not present

## 2019-06-21 DIAGNOSIS — E039 Hypothyroidism, unspecified: Secondary | ICD-10-CM | POA: Diagnosis not present

## 2019-06-21 DIAGNOSIS — Z6823 Body mass index (BMI) 23.0-23.9, adult: Secondary | ICD-10-CM | POA: Diagnosis not present

## 2019-07-04 DIAGNOSIS — M5416 Radiculopathy, lumbar region: Secondary | ICD-10-CM | POA: Diagnosis not present

## 2019-07-04 DIAGNOSIS — M47816 Spondylosis without myelopathy or radiculopathy, lumbar region: Secondary | ICD-10-CM | POA: Diagnosis not present

## 2019-07-04 DIAGNOSIS — M4310 Spondylolisthesis, site unspecified: Secondary | ICD-10-CM | POA: Diagnosis not present

## 2019-07-19 DIAGNOSIS — M5416 Radiculopathy, lumbar region: Secondary | ICD-10-CM | POA: Diagnosis not present

## 2019-08-01 DIAGNOSIS — N390 Urinary tract infection, site not specified: Secondary | ICD-10-CM | POA: Diagnosis not present

## 2019-08-11 DIAGNOSIS — M7138 Other bursal cyst, other site: Secondary | ICD-10-CM | POA: Diagnosis not present

## 2019-08-11 DIAGNOSIS — M4310 Spondylolisthesis, site unspecified: Secondary | ICD-10-CM | POA: Diagnosis not present

## 2019-08-16 ENCOUNTER — Other Ambulatory Visit: Payer: Self-pay | Admitting: Neurosurgery

## 2019-08-23 NOTE — Progress Notes (Signed)
[place] [place] [address] Phone: [phone] Fax: [phone]      Your procedure is scheduled on Friday, July 9th.  Report to [facility] Main Entrance "A" at 9:30 A.M., and check in at the Admitting office.  Call this number if you have problems the morning of surgery:  [phone]  Call [phone] if you have any questions prior to your surgery date Monday-Friday 8am-4pm    Remember:  Do not eat or drink after midnight the night before your surgery     Take these medicines the morning of surgery with A SIP OF WATER   Tylenol - if needed  Eye drops - if needed  Diazepam (Valium) - if needed  Estradiol (Estrace)  Flonase nasal spray - if needed  Gabapentin (Neurontin)  Levothyroxine (Synthroid)   Oxycodone-Acetaminophen - if needed     As of today, STOP taking any Aspirin (unless otherwise instructed by your surgeon) Aleve, Naproxen, Ibuprofen, Motrin, Advil, Goody's, BC's, all herbal medications, fish oil, and all vitamins.                      Do not wear jewelry, make up, or nail polish            Do not wear lotions, powders, perfumes, or deodorant.            Do not shave 48 hours prior to surgery.              Do not bring valuables to the hospital.            [facility] is not responsible for any belongings or valuables.  Do NOT Smoke (Tobacco/Vaping) or drink Alcohol 24 hours prior to your procedure If you use a CPAP at night, you may bring all equipment for your overnight stay.   Contacts, glasses, dentures or bridgework may not be worn into surgery.      For patients admitted to the hospital, discharge time will be determined by your treatment team.   Patients discharged the day of surgery will not be allowed to drive home, and someone needs to stay with them for 24 hours.    Special instructions:   [address]- Preparing For Surgery  Before surgery, you can play an important role.  Because skin is not sterile, your skin needs to be as free of germs as possible. You can reduce the number of germs on your skin by washing with CHG (chlorahexidine gluconate) Soap before surgery.  CHG is an antiseptic cleaner which kills germs and bonds with the skin to continue killing germs even after washing.    Oral Hygiene is also important to reduce your risk of infection.  Remember - BRUSH YOUR TEETH THE MORNING OF SURGERY WITH YOUR REGULAR TOOTHPASTE  Please do not use if you have an allergy to CHG or antibacterial soaps. If your skin becomes reddened/irritated stop using the CHG.  Do not shave (including legs and underarms) for at least 48 hours prior to first CHG shower. It is OK to shave your face.  Please follow these instructions carefully.   1. Shower the NIGHT BEFORE SURGERY and the MORNING OF SURGERY with CHG Soap.   2. If you chose to wash your hair, wash your hair first as usual with your normal shampoo.  3. After you shampoo, rinse your hair and body thoroughly to remove the shampoo.  4.  Use CHG as you would any other liquid soap. You can apply CHG directly to the skin and wash gently with a scrungie or a clean washcloth.   5. Apply the CHG Soap to your body ONLY FROM THE NECK DOWN.  Do not use on open wounds or open sores. Avoid contact with your eyes, ears, mouth and genitals (private parts). Wash Face and genitals (private parts)  with your normal soap.   6. Wash thoroughly, paying special attention to the area where your surgery will be performed.  7. Thoroughly rinse your body with warm water from the neck down.  8. DO NOT shower/wash with your normal soap after using and rinsing off the CHG Soap.  9. Pat yourself dry with a CLEAN TOWEL.  10. Wear CLEAN PAJAMAS to bed the night before surgery  11. Place CLEAN SHEETS on your bed the night of your first shower and DO NOT SLEEP WITH PETS.   Day of Surgery: Wear Clean/Comfortable clothing the morning of  surgery Do not apply any deodorants/lotions.   Remember to brush your teeth WITH YOUR REGULAR TOOTHPASTE.   Please read over the following fact sheets that you were given.

## 2019-08-24 ENCOUNTER — Other Ambulatory Visit (HOSPITAL_COMMUNITY)
Admission: RE | Admit: 2019-08-24 | Discharge: 2019-08-24 | Disposition: A | Discharge status: Home / Self Care | Payer: PPO | Source: Ambulatory Visit | Attending: Neurosurgery | Admitting: Neurosurgery

## 2019-08-24 ENCOUNTER — Other Ambulatory Visit: Payer: Self-pay

## 2019-08-24 ENCOUNTER — Encounter (HOSPITAL_COMMUNITY): Payer: Self-pay

## 2019-08-24 ENCOUNTER — Encounter (HOSPITAL_COMMUNITY)
Admission: RE | Admit: 2019-08-24 | Discharge: 2019-08-24 | Disposition: A | Discharge status: Home / Self Care | Payer: PPO | Source: Ambulatory Visit | Attending: Neurosurgery | Admitting: Neurosurgery

## 2019-08-24 DIAGNOSIS — Z20822 Contact with and (suspected) exposure to covid-19: Secondary | ICD-10-CM | POA: Diagnosis not present

## 2019-08-24 DIAGNOSIS — Z01812 Encounter for preprocedural laboratory examination: Secondary | ICD-10-CM | POA: Insufficient documentation

## 2019-08-24 HISTORY — DX: Headache, unspecified: R51.9

## 2019-08-24 LAB — CBC WITH DIFFERENTIAL/PLATELET
Abs Immature Granulocytes: 0.03 10*3/uL (ref 0.00–0.07)
Basophils Absolute: 0 10*3/uL (ref 0.0–0.1)
Basophils Relative: 1 %
Eosinophils Absolute: 0.1 10*3/uL (ref 0.0–0.5)
Eosinophils Relative: 2 %
HCT: 36.9 % (ref 36.0–46.0)
Hemoglobin: 12.1 g/dL (ref 12.0–15.0)
Immature Granulocytes: 1 %
Lymphocytes Relative: 34 %
Lymphs Abs: 1.8 10*3/uL (ref 0.7–4.0)
MCH: 30.7 pg (ref 26.0–34.0)
MCHC: 32.8 g/dL (ref 30.0–36.0)
MCV: 93.7 fL (ref 80.0–100.0)
Monocytes Absolute: 0.5 10*3/uL (ref 0.1–1.0)
Monocytes Relative: 9 %
Neutro Abs: 3 10*3/uL (ref 1.7–7.7)
Neutrophils Relative %: 53 %
Platelets: 175 10*3/uL (ref 150–400)
RBC: 3.94 MIL/uL (ref 3.87–5.11)
RDW: 12.5 % (ref 11.5–15.5)
WBC: 5.4 10*3/uL (ref 4.0–10.5)
nRBC: 0 % (ref 0.0–0.2)

## 2019-08-24 LAB — SURGICAL PCR SCREEN
MRSA, PCR: NEGATIVE
Staphylococcus aureus: POSITIVE — AB

## 2019-08-24 LAB — TYPE AND SCREEN
ABO/RH(D): A POS
Antibody Screen: NEGATIVE

## 2019-08-24 LAB — BASIC METABOLIC PANEL
Anion gap: 10 (ref 5–15)
BUN: 11 mg/dL (ref 8–23)
CO2: 27 mmol/L (ref 22–32)
Calcium: 9.3 mg/dL (ref 8.9–10.3)
Chloride: 95 mmol/L — ABNORMAL LOW (ref 98–111)
Creatinine, Ser: 0.82 mg/dL (ref 0.44–1.00)
GFR calc Af Amer: >60 mL/min (ref >60)
GFR calc non Af Amer: >60 mL/min (ref >60)
Glucose, Bld: 101 mg/dL — ABNORMAL HIGH (ref 70–99)
Potassium: 3.4 mmol/L — ABNORMAL LOW (ref 3.5–5.1)
Sodium: 132 mmol/L — ABNORMAL LOW (ref 135–145)

## 2019-08-24 LAB — SARS CORONAVIRUS 2 (TAT 6-24 HRS): SARS Coronavirus 2: NEGATIVE

## 2019-08-24 NOTE — Progress Notes (Signed)
PCP - Laverna Peace, NP Cardiologist - Denies  PPM/ICD - Denies  Chest x-ray - N/A EKG - 02/17/20 Stress Test - Denies ECHO - Denies Cardiac Cath - Denies  Sleep Study - Denies  Patient denies being diabetic  Blood Thinner Instructions: N/A Aspirin Instructions: N/A  ERAS Protcol - No PRE-SURGERY Ensure or G2- No  COVID TEST- 08/24/19   Anesthesia review: No  Patient denies shortness of breath, fever, cough and chest pain at PAT appointment   All instructions explained to the patient, with a verbal understanding of the material. Patient agrees to go over the instructions while at home for a better understanding. Patient also instructed to self quarantine after being tested for COVID-19. The opportunity to ask questions was provided.

## 2019-08-26 ENCOUNTER — Encounter (HOSPITAL_COMMUNITY)
Admission: RE | Disposition: A | Discharge status: Home / Self Care | Payer: Self-pay | Source: Home / Self Care | Attending: Neurosurgery

## 2019-08-26 ENCOUNTER — Ambulatory Visit (HOSPITAL_COMMUNITY): Payer: PPO | Admitting: Certified Registered Nurse Anesthetist

## 2019-08-26 ENCOUNTER — Other Ambulatory Visit: Payer: Self-pay

## 2019-08-26 ENCOUNTER — Ambulatory Visit (HOSPITAL_COMMUNITY): Payer: PPO

## 2019-08-26 ENCOUNTER — Observation Stay (HOSPITAL_COMMUNITY)
Admission: RE | Admit: 2019-08-26 | Discharge: 2019-08-28 | Disposition: A | Discharge status: Home / Self Care | Payer: PPO | Attending: Neurosurgery | Admitting: Neurosurgery

## 2019-08-26 ENCOUNTER — Encounter (HOSPITAL_COMMUNITY): Payer: Self-pay | Admitting: Neurosurgery

## 2019-08-26 DIAGNOSIS — M7138 Other bursal cyst, other site: Secondary | ICD-10-CM | POA: Diagnosis not present

## 2019-08-26 DIAGNOSIS — M4316 Spondylolisthesis, lumbar region: Secondary | ICD-10-CM | POA: Diagnosis not present

## 2019-08-26 DIAGNOSIS — Z419 Encounter for procedure for purposes other than remedying health state, unspecified: Secondary | ICD-10-CM

## 2019-08-26 DIAGNOSIS — M5416 Radiculopathy, lumbar region: Secondary | ICD-10-CM | POA: Diagnosis not present

## 2019-08-26 DIAGNOSIS — M4326 Fusion of spine, lumbar region: Secondary | ICD-10-CM | POA: Diagnosis not present

## 2019-08-26 DIAGNOSIS — Z981 Arthrodesis status: Secondary | ICD-10-CM | POA: Diagnosis not present

## 2019-08-26 DIAGNOSIS — I10 Essential (primary) hypertension: Secondary | ICD-10-CM | POA: Diagnosis not present

## 2019-08-26 SURGERY — POSTERIOR LUMBAR FUSION 1 LEVEL
Anesthesia: General | Site: Back

## 2019-08-26 MED ORDER — GABAPENTIN 600 MG PO TABS
600.0000 mg | ORAL_TABLET | Freq: Three times a day (TID) | ORAL | Status: DC
  Administered 2019-08-26 – 2019-08-28 (×6): 600 mg via ORAL
  Filled 2019-08-26 (×10): qty 1

## 2019-08-26 MED ORDER — ROCURONIUM BROMIDE 10 MG/ML (PF) SYRINGE
PREFILLED_SYRINGE | INTRAVENOUS | Status: AC
  Filled 2019-08-26: qty 10

## 2019-08-26 MED ORDER — HYDROMORPHONE HCL 1 MG/ML IJ SOLN
INTRAMUSCULAR | Status: AC
  Filled 2019-08-26: qty 0.5

## 2019-08-26 MED ORDER — DIAZEPAM 5 MG PO TABS
5.0000 mg | ORAL_TABLET | Freq: Four times a day (QID) | ORAL | Status: DC | PRN
  Administered 2019-08-26 – 2019-08-28 (×5): 5 mg via ORAL
  Filled 2019-08-26 (×4): qty 1

## 2019-08-26 MED ORDER — SODIUM CHLORIDE 0.9 % IV SOLN
INTRAVENOUS | Status: DC | PRN
  Administered 2019-08-26: 500 mL

## 2019-08-26 MED ORDER — PRAVASTATIN SODIUM 10 MG PO TABS
20.0000 mg | ORAL_TABLET | Freq: Every day | ORAL | Status: DC
  Administered 2019-08-26 – 2019-08-27 (×2): 20 mg via ORAL
  Filled 2019-08-26 (×2): qty 2

## 2019-08-26 MED ORDER — ONDANSETRON HCL 4 MG/2ML IJ SOLN: 4.0000 mg | Freq: Once | INTRAMUSCULAR | Status: DC | PRN

## 2019-08-26 MED ORDER — ONDANSETRON HCL 4 MG/2ML IJ SOLN
INTRAMUSCULAR | Status: AC
  Filled 2019-08-26: qty 2

## 2019-08-26 MED ORDER — TRAZODONE HCL 50 MG PO TABS
150.0000 mg | ORAL_TABLET | Freq: Every day | ORAL | Status: DC
  Administered 2019-08-26 – 2019-08-27 (×2): 150 mg via ORAL
  Filled 2019-08-26: qty 3
  Filled 2019-08-26: qty 1

## 2019-08-26 MED ORDER — FENTANYL CITRATE (PF) 250 MCG/5ML IJ SOLN
INTRAMUSCULAR | Status: AC
  Filled 2019-08-26: qty 5

## 2019-08-26 MED ORDER — VANCOMYCIN HCL 1000 MG IV SOLR
INTRAVENOUS | Status: AC
  Filled 2019-08-26: qty 1000

## 2019-08-26 MED ORDER — POLYVINYL ALCOHOL 1.4 % OP SOLN
1.0000 [drp] | Freq: Every day | OPHTHALMIC | Status: DC | PRN
  Filled 2019-08-26: qty 15

## 2019-08-26 MED ORDER — HYDROMORPHONE HCL 1 MG/ML IJ SOLN: 1.0000 mg | INTRAMUSCULAR | Status: DC | PRN

## 2019-08-26 MED ORDER — SODIUM CHLORIDE 0.9 % IV SOLN: 250.0000 mL | INTRAVENOUS | Status: DC

## 2019-08-26 MED ORDER — OXYCODONE HCL 5 MG/5ML PO SOLN: 5.0000 mg | Freq: Once | ORAL | Status: DC | PRN

## 2019-08-26 MED ORDER — LIDOCAINE 2% (20 MG/ML) 5 ML SYRINGE
INTRAMUSCULAR | Status: DC | PRN
  Administered 2019-08-26: 150 mg via INTRAVENOUS

## 2019-08-26 MED ORDER — BUPIVACAINE HCL (PF) 0.25 % IJ SOLN
INTRAMUSCULAR | Status: DC | PRN
  Administered 2019-08-26: 20 mL

## 2019-08-26 MED ORDER — ACETAMINOPHEN 325 MG PO TABS
650.0000 mg | ORAL_TABLET | ORAL | Status: DC | PRN
  Administered 2019-08-27 – 2019-08-28 (×4): 650 mg via ORAL
  Filled 2019-08-26 (×4): qty 2

## 2019-08-26 MED ORDER — CEFAZOLIN SODIUM-DEXTROSE 2-4 GM/100ML-% IV SOLN
INTRAVENOUS | Status: AC
  Filled 2019-08-26: qty 100

## 2019-08-26 MED ORDER — BISACODYL 10 MG RE SUPP: 10.0000 mg | Freq: Every day | RECTAL | Status: DC | PRN

## 2019-08-26 MED ORDER — DEXAMETHASONE SODIUM PHOSPHATE 10 MG/ML IJ SOLN
INTRAMUSCULAR | Status: AC
  Filled 2019-08-26: qty 1

## 2019-08-26 MED ORDER — LIDOCAINE 2% (20 MG/ML) 5 ML SYRINGE
INTRAMUSCULAR | Status: AC
  Filled 2019-08-26: qty 5

## 2019-08-26 MED ORDER — MEPERIDINE HCL 25 MG/ML IJ SOLN: 6.2500 mg | INTRAMUSCULAR | Status: DC | PRN

## 2019-08-26 MED ORDER — EPHEDRINE SULFATE-NACL 50-0.9 MG/10ML-% IV SOSY
PREFILLED_SYRINGE | INTRAVENOUS | Status: DC | PRN
  Administered 2019-08-26: 10 mg via INTRAVENOUS
  Administered 2019-08-26: 5 mg via INTRAVENOUS
  Administered 2019-08-26: 10 mg via INTRAVENOUS

## 2019-08-26 MED ORDER — MIDAZOLAM HCL 2 MG/2ML IJ SOLN
INTRAMUSCULAR | Status: AC
  Filled 2019-08-26: qty 2

## 2019-08-26 MED ORDER — FLEET ENEMA 7-19 GM/118ML RE ENEM: 1.0000 | ENEMA | Freq: Once | RECTAL | Status: DC | PRN

## 2019-08-26 MED ORDER — HYDROMORPHONE HCL 1 MG/ML IJ SOLN
INTRAMUSCULAR | Status: DC | PRN
  Administered 2019-08-26: .5 mg via INTRAVENOUS

## 2019-08-26 MED ORDER — SODIUM CHLORIDE 0.9% FLUSH: 3.0000 mL | Freq: Two times a day (BID) | INTRAVENOUS | Status: DC

## 2019-08-26 MED ORDER — HYDROMORPHONE HCL 1 MG/ML IJ SOLN
INTRAMUSCULAR | Status: AC
  Filled 2019-08-26: qty 1

## 2019-08-26 MED ORDER — BUPIVACAINE HCL (PF) 0.25 % IJ SOLN
INTRAMUSCULAR | Status: AC
  Filled 2019-08-26: qty 30

## 2019-08-26 MED ORDER — GLYCOPYRROLATE 0.2 MG/ML IJ SOLN
INTRAMUSCULAR | Status: DC | PRN
Start: 2019-08-26 — End: 2019-08-26
  Administered 2019-08-26: .1 mg via INTRAVENOUS

## 2019-08-26 MED ORDER — THROMBIN 20000 UNITS EX SOLR
CUTANEOUS | Status: AC
  Filled 2019-08-26: qty 20000

## 2019-08-26 MED ORDER — LEVOTHYROXINE SODIUM 25 MCG PO TABS
25.0000 ug | ORAL_TABLET | Freq: Every day | ORAL | Status: DC
  Administered 2019-08-27 – 2019-08-28 (×2): 25 ug via ORAL
  Filled 2019-08-26 (×2): qty 1

## 2019-08-26 MED ORDER — FENTANYL CITRATE (PF) 250 MCG/5ML IJ SOLN
INTRAMUSCULAR | Status: DC | PRN
  Administered 2019-08-26: 200 ug via INTRAVENOUS

## 2019-08-26 MED ORDER — ESTRADIOL 1 MG PO TABS
0.5000 mg | ORAL_TABLET | Freq: Every day | ORAL | Status: DC
  Administered 2019-08-27 – 2019-08-28 (×2): 0.5 mg via ORAL
  Filled 2019-08-26 (×2): qty 0.5

## 2019-08-26 MED ORDER — SODIUM CHLORIDE 0.9% FLUSH: 3.0000 mL | INTRAVENOUS | Status: DC | PRN

## 2019-08-26 MED ORDER — LACTATED RINGERS IV SOLN: INTRAVENOUS | Status: DC

## 2019-08-26 MED ORDER — POLYETHYLENE GLYCOL 3350 17 G PO PACK: 17.0000 g | PACK | Freq: Every day | ORAL | Status: DC | PRN

## 2019-08-26 MED ORDER — SUCCINYLCHOLINE CHLORIDE 200 MG/10ML IV SOSY
PREFILLED_SYRINGE | INTRAVENOUS | Status: AC
  Filled 2019-08-26: qty 10

## 2019-08-26 MED ORDER — CEFAZOLIN SODIUM 1 G IJ SOLR
INTRAMUSCULAR | Status: AC
  Filled 2019-08-26: qty 10

## 2019-08-26 MED ORDER — CHLORHEXIDINE GLUCONATE 0.12 % MT SOLN
OROMUCOSAL | Status: AC
  Administered 2019-08-26: 15 mL via OROMUCOSAL
  Filled 2019-08-26: qty 15

## 2019-08-26 MED ORDER — CHLORHEXIDINE GLUCONATE CLOTH 2 % EX PADS: 6.0000 | MEDICATED_PAD | Freq: Once | CUTANEOUS | Status: DC

## 2019-08-26 MED ORDER — OXYCODONE HCL 5 MG PO TABS: 5.0000 mg | ORAL_TABLET | Freq: Once | ORAL | Status: DC | PRN

## 2019-08-26 MED ORDER — VANCOMYCIN HCL 1000 MG IV SOLR
INTRAVENOUS | Status: DC | PRN
  Administered 2019-08-26: 1000 mg via TOPICAL

## 2019-08-26 MED ORDER — ORAL CARE MOUTH RINSE: 15.0000 mL | Freq: Once | OROMUCOSAL | Status: AC

## 2019-08-26 MED ORDER — ONDANSETRON HCL 4 MG/2ML IJ SOLN
INTRAMUSCULAR | Status: DC | PRN
  Administered 2019-08-26: 4 mg via INTRAVENOUS

## 2019-08-26 MED ORDER — HYDROCHLOROTHIAZIDE 25 MG PO TABS
25.0000 mg | ORAL_TABLET | Freq: Every day | ORAL | Status: DC
  Administered 2019-08-28: 25 mg via ORAL
  Filled 2019-08-26 (×3): qty 1

## 2019-08-26 MED ORDER — ACETAMINOPHEN 650 MG RE SUPP: 650.0000 mg | RECTAL | Status: DC | PRN

## 2019-08-26 MED ORDER — CHLORHEXIDINE GLUCONATE 0.12 % MT SOLN: 15.0000 mL | Freq: Once | OROMUCOSAL | Status: AC

## 2019-08-26 MED ORDER — HYDROMORPHONE HCL 1 MG/ML IJ SOLN
0.2500 mg | INTRAMUSCULAR | Status: DC | PRN
  Administered 2019-08-26 (×4): 0.5 mg via INTRAVENOUS

## 2019-08-26 MED ORDER — MIDAZOLAM HCL 2 MG/2ML IJ SOLN
INTRAMUSCULAR | Status: DC | PRN
  Administered 2019-08-26: 1 mg via INTRAVENOUS

## 2019-08-26 MED ORDER — 0.9 % SODIUM CHLORIDE (POUR BTL) OPTIME
TOPICAL | Status: DC | PRN
  Administered 2019-08-26: 1000 mL

## 2019-08-26 MED ORDER — PHENOL 1.4 % MT LIQD: 1.0000 | OROMUCOSAL | Status: DC | PRN

## 2019-08-26 MED ORDER — PROSIGHT PO TABS
1.0000 | ORAL_TABLET | Freq: Two times a day (BID) | ORAL | Status: DC
  Administered 2019-08-27 – 2019-08-28 (×4): 1 via ORAL
  Filled 2019-08-26 (×6): qty 1

## 2019-08-26 MED ORDER — ONDANSETRON HCL 4 MG/2ML IJ SOLN: 4.0000 mg | Freq: Four times a day (QID) | INTRAMUSCULAR | Status: DC | PRN

## 2019-08-26 MED ORDER — FLUTICASONE PROPIONATE 50 MCG/ACT NA SUSP
2.0000 | Freq: Every day | NASAL | Status: DC | PRN
  Filled 2019-08-26: qty 16

## 2019-08-26 MED ORDER — MENTHOL 3 MG MT LOZG: 1.0000 | LOZENGE | OROMUCOSAL | Status: DC | PRN

## 2019-08-26 MED ORDER — ROCURONIUM BROMIDE 10 MG/ML (PF) SYRINGE
PREFILLED_SYRINGE | INTRAVENOUS | Status: DC | PRN
  Administered 2019-08-26: 50 mg via INTRAVENOUS
  Administered 2019-08-26: 20 mg via INTRAVENOUS

## 2019-08-26 MED ORDER — LISINOPRIL 20 MG PO TABS
20.0000 mg | ORAL_TABLET | Freq: Every day | ORAL | Status: DC
  Administered 2019-08-28: 20 mg via ORAL
  Filled 2019-08-26 (×3): qty 1

## 2019-08-26 MED ORDER — OXYCODONE HCL 5 MG PO TABS
10.0000 mg | ORAL_TABLET | ORAL | Status: DC | PRN
  Administered 2019-08-26 – 2019-08-28 (×9): 10 mg via ORAL
  Filled 2019-08-26 (×9): qty 2

## 2019-08-26 MED ORDER — DIAZEPAM 5 MG PO TABS
ORAL_TABLET | ORAL | Status: AC
  Filled 2019-08-26: qty 1

## 2019-08-26 MED ORDER — ONDANSETRON HCL 4 MG PO TABS: 4.0000 mg | ORAL_TABLET | Freq: Four times a day (QID) | ORAL | Status: DC | PRN

## 2019-08-26 MED ORDER — PROPOFOL 10 MG/ML IV BOLUS
INTRAVENOUS | Status: DC | PRN
  Administered 2019-08-26: 140 mg via INTRAVENOUS

## 2019-08-26 MED ORDER — EPHEDRINE 5 MG/ML INJ
INTRAVENOUS | Status: AC
  Filled 2019-08-26: qty 10

## 2019-08-26 MED ORDER — DEXAMETHASONE SODIUM PHOSPHATE 10 MG/ML IJ SOLN: 10.0000 mg | Freq: Once | INTRAMUSCULAR | Status: DC

## 2019-08-26 MED ORDER — ALUM & MAG HYDROXIDE-SIMETH 200-200-20 MG/5ML PO SUSP: 15.0000 mL | Freq: Four times a day (QID) | ORAL | Status: DC | PRN

## 2019-08-26 MED ORDER — SUGAMMADEX SODIUM 200 MG/2ML IV SOLN
INTRAVENOUS | Status: DC | PRN
  Administered 2019-08-26: 100 mg via INTRAVENOUS

## 2019-08-26 MED ORDER — PROPOFOL 1000 MG/100ML IV EMUL
INTRAVENOUS | Status: AC
  Filled 2019-08-26: qty 100

## 2019-08-26 MED ORDER — PHENYLEPHRINE 40 MCG/ML (10ML) SYRINGE FOR IV PUSH (FOR BLOOD PRESSURE SUPPORT)
PREFILLED_SYRINGE | INTRAVENOUS | Status: AC
  Filled 2019-08-26: qty 10

## 2019-08-26 MED ORDER — CEFAZOLIN SODIUM-DEXTROSE 1-4 GM/50ML-% IV SOLN
1.0000 g | Freq: Three times a day (TID) | INTRAVENOUS | Status: AC
  Administered 2019-08-26 – 2019-08-27 (×2): 1 g via INTRAVENOUS
  Filled 2019-08-26 (×2): qty 50

## 2019-08-26 MED ORDER — ACETAMINOPHEN 10 MG/ML IV SOLN
INTRAVENOUS | Status: AC
  Filled 2019-08-26: qty 100

## 2019-08-26 MED ORDER — LISINOPRIL-HYDROCHLOROTHIAZIDE 20-25 MG PO TABS: 1.0000 | ORAL_TABLET | Freq: Every day | ORAL | Status: DC

## 2019-08-26 MED ORDER — ACETAMINOPHEN 10 MG/ML IV SOLN
INTRAVENOUS | Status: DC | PRN
Start: 2019-08-26 — End: 2019-08-26
  Administered 2019-08-26: 1000 mg via INTRAVENOUS

## 2019-08-26 MED ORDER — THROMBIN 20000 UNITS EX SOLR
CUTANEOUS | Status: DC | PRN
  Administered 2019-08-26: 20 mL via TOPICAL

## 2019-08-26 MED ORDER — MELATONIN 5 MG PO TABS
10.0000 mg | ORAL_TABLET | Freq: Every day | ORAL | Status: DC
  Administered 2019-08-26 – 2019-08-27 (×2): 10 mg via ORAL
  Filled 2019-08-26 (×2): qty 2

## 2019-08-26 MED ORDER — DEXAMETHASONE SODIUM PHOSPHATE 10 MG/ML IJ SOLN
INTRAMUSCULAR | Status: DC | PRN
  Administered 2019-08-26: 10 mg via INTRAVENOUS

## 2019-08-26 MED ORDER — CEFAZOLIN SODIUM-DEXTROSE 2-4 GM/100ML-% IV SOLN
2.0000 g | INTRAVENOUS | Status: AC
  Administered 2019-08-26: 2 g via INTRAVENOUS

## 2019-08-26 MED ORDER — HYDROCODONE-ACETAMINOPHEN 10-325 MG PO TABS
1.0000 | ORAL_TABLET | ORAL | Status: DC | PRN
  Administered 2019-08-27 – 2019-08-28 (×3): 1 via ORAL
  Filled 2019-08-26 (×3): qty 1

## 2019-08-26 SURGICAL SUPPLY — 65 items
BAG DECANTER FOR FLEXI CONT (MISCELLANEOUS) ×3 IMPLANT
BENZOIN TINCTURE PRP APPL 2/3 (GAUZE/BANDAGES/DRESSINGS) ×3 IMPLANT
BLADE CLIPPER SURG (BLADE) IMPLANT
BUR CUTTER 7.0 ROUND (BURR) ×3 IMPLANT
BUR MATCHSTICK NEURO 3.0 LAGG (BURR) ×3 IMPLANT
CANISTER SUCT 3000ML PPV (MISCELLANEOUS) ×3 IMPLANT
CARTRIDGE OIL MAESTRO DRILL (MISCELLANEOUS) ×1 IMPLANT
CLOSURE WOUND 1/2 X4 (GAUZE/BANDAGES/DRESSINGS) ×1
CNTNR URN SCR LID CUP LEK RST (MISCELLANEOUS) ×1 IMPLANT
CONT SPEC 4OZ STRL OR WHT (MISCELLANEOUS) ×2
COVER BACK TABLE 60X90IN (DRAPES) ×3 IMPLANT
COVER WAND RF STERILE (DRAPES) ×3 IMPLANT
DECANTER SPIKE VIAL GLASS SM (MISCELLANEOUS) ×3 IMPLANT
DERMABOND ADVANCED (GAUZE/BANDAGES/DRESSINGS) ×2
DERMABOND ADVANCED .7 DNX12 (GAUZE/BANDAGES/DRESSINGS) ×1 IMPLANT
DEVICE INTERBODY ELEVATE 23X9 (Cage) ×6 IMPLANT
DIFFUSER DRILL AIR PNEUMATIC (MISCELLANEOUS) ×3 IMPLANT
DRAPE C-ARM 42X72 X-RAY (DRAPES) ×6 IMPLANT
DRAPE HALF SHEET 40X57 (DRAPES) ×3 IMPLANT
DRAPE LAPAROTOMY 100X72X124 (DRAPES) ×3 IMPLANT
DRAPE SURG 17X23 STRL (DRAPES) ×12 IMPLANT
DRSG OPSITE POSTOP 4X6 (GAUZE/BANDAGES/DRESSINGS) ×3 IMPLANT
DRSG OPSITE POSTOP 4X8 (GAUZE/BANDAGES/DRESSINGS) ×3 IMPLANT
DURAPREP 26ML APPLICATOR (WOUND CARE) ×3 IMPLANT
ELECT REM PT RETURN 9FT ADLT (ELECTROSURGICAL) ×3
ELECTRODE REM PT RTRN 9FT ADLT (ELECTROSURGICAL) ×1 IMPLANT
EVACUATOR 1/8 PVC DRAIN (DRAIN) IMPLANT
GAUZE 4X4 16PLY RFD (DISPOSABLE) IMPLANT
GAUZE SPONGE 4X4 12PLY STRL (GAUZE/BANDAGES/DRESSINGS) IMPLANT
GLOVE BIO SURGEON STRL SZ 6.5 (GLOVE) ×2 IMPLANT
GLOVE BIO SURGEONS STRL SZ 6.5 (GLOVE) ×1
GLOVE BIOGEL PI IND STRL 6.5 (GLOVE) ×1 IMPLANT
GLOVE BIOGEL PI INDICATOR 6.5 (GLOVE) ×2
GLOVE ECLIPSE 9.0 STRL (GLOVE) ×6 IMPLANT
GLOVE EXAM NITRILE XL STR (GLOVE) IMPLANT
GOWN STRL REUS W/ TWL LRG LVL3 (GOWN DISPOSABLE) IMPLANT
GOWN STRL REUS W/ TWL XL LVL3 (GOWN DISPOSABLE) ×2 IMPLANT
GOWN STRL REUS W/TWL 2XL LVL3 (GOWN DISPOSABLE) IMPLANT
GOWN STRL REUS W/TWL LRG LVL3 (GOWN DISPOSABLE)
GOWN STRL REUS W/TWL XL LVL3 (GOWN DISPOSABLE) ×4
GRAFT BN 10X1XDBM MAGNIFUSE (Bone Implant) ×1 IMPLANT
GRAFT BN 5X1XSPNE CVD POST DBM (Bone Implant) ×1 IMPLANT
GRAFT BONE MAGNIFUSE 1X10CM (Bone Implant) ×2 IMPLANT
GRAFT BONE MAGNIFUSE 1X5CM (Bone Implant) ×2 IMPLANT
KIT BASIN OR (CUSTOM PROCEDURE TRAY) ×3 IMPLANT
KIT TURNOVER KIT B (KITS) ×3 IMPLANT
MILL MEDIUM DISP (BLADE) ×3 IMPLANT
NEEDLE HYPO 22GX1.5 SAFETY (NEEDLE) ×3 IMPLANT
NS IRRIG 1000ML POUR BTL (IV SOLUTION) ×3 IMPLANT
OIL CARTRIDGE MAESTRO DRILL (MISCELLANEOUS) ×3
PACK LAMINECTOMY NEURO (CUSTOM PROCEDURE TRAY) ×3 IMPLANT
ROD RELINE-O 5.5X100 LORD (Rod) ×3 IMPLANT
ROD RELINE-O LORD 5.5X40 (Rod) ×3 IMPLANT
SCREW LOCK RELINE 5.5 TULIP (Screw) ×18 IMPLANT
SCREW RELINE-O POLY 6.5X45 (Screw) ×9 IMPLANT
SPONGE SURGIFOAM ABS GEL 100 (HEMOSTASIS) ×3 IMPLANT
STRIP CLOSURE SKIN 1/2X4 (GAUZE/BANDAGES/DRESSINGS) ×2 IMPLANT
SUT VIC AB 0 CT1 18XCR BRD8 (SUTURE) ×2 IMPLANT
SUT VIC AB 0 CT1 8-18 (SUTURE) ×4
SUT VIC AB 2-0 CT1 18 (SUTURE) ×3 IMPLANT
SUT VIC AB 3-0 SH 8-18 (SUTURE) ×6 IMPLANT
TOWEL GREEN STERILE (TOWEL DISPOSABLE) ×3 IMPLANT
TOWEL GREEN STERILE FF (TOWEL DISPOSABLE) ×3 IMPLANT
TRAY FOLEY MTR SLVR 16FR STAT (SET/KITS/TRAYS/PACK) ×3 IMPLANT
WATER STERILE IRR 1000ML POUR (IV SOLUTION) ×3 IMPLANT

## 2019-08-26 NOTE — Anesthesia Preprocedure Evaluation (Signed)
Anesthesia Evaluation  Patient identified by MRN, date of birth, ID band Patient awake    Reviewed: Allergy & Precautions, NPO status , Patient's Chart, lab work & pertinent test results, reviewed documented beta blocker date and time   History of Anesthesia Complications (+) PONV and history of anesthetic complications  Airway Mallampati: II  TM Distance: >3 FB Neck ROM: Full    Dental no notable dental hx. (+) Caps, Teeth Intact   Pulmonary neg pulmonary ROS,    Pulmonary exam normal breath sounds clear to auscultation       Cardiovascular hypertension, Pt. on medications Normal cardiovascular exam Rhythm:Regular Rate:Normal     Neuro/Psych  Headaches, negative psych ROS   GI/Hepatic negative GI ROS, Neg liver ROS,   Endo/Other  Hypothyroidism   Renal/GU negative Renal ROS  negative genitourinary   Musculoskeletal  (+) Arthritis , Osteoarthritis,  Synovial cyst L4-5 Low back pain   Abdominal   Peds  Hematology negative hematology ROS (+)   Anesthesia Other Findings   Reproductive/Obstetrics                             Anesthesia Physical Anesthesia Plan  ASA: II  Anesthesia Plan: General   Post-op Pain Management:    Induction: Intravenous  PONV Risk Score and Plan: 4 or greater and Midazolam, Ondansetron, Dexamethasone and Treatment may vary due to age or medical condition  Airway Management Planned: Oral ETT  Additional Equipment:   Intra-op Plan:   Post-operative Plan: Extubation in OR  Informed Consent: I have reviewed the patients History and Physical, chart, labs and discussed the procedure including the risks, benefits and alternatives for the proposed anesthesia with the patient or authorized representative who has indicated his/her understanding and acceptance.     Dental advisory given  Plan Discussed with: CRNA and Surgeon  Anesthesia Plan Comments:          Anesthesia Quick Evaluation

## 2019-08-26 NOTE — Op Note (Signed)
Date of procedure: 08/26/2019  Date of dictation: Same  Service: Neurosurgery  Preoperative diagnosis: L4-5 unstable degenerative spondylolisthesis, bilateral L4-5 adherent synovial cyst with radiculopathy  Status post prior L2-3 and L34 anterior lateral interbody fusion with left-sided pedicle screw fixation from L2-L4  Postoperative diagnosis: Same  Procedure Name: Bilateral L4-5 decompressive laminotomies and resection of adherent synovial cyst, microdissection required  L4-5 posterior lumbar interbody fusion utilizing interbody cages and locally harvested autograft  L4-5 posterior lateral arthrodesis utilizing pedicle screw fixation and local autograft  Reexploration of L2-L4 posterior lumbar fusion with revision of instrumentation and augmentation of fusion with morselized allograft and local autograft  Surgeon:Arav Bannister A.Brylinn Teaney, M.D.  Asst. Surgeon: Reinaldo Meeker, NP  Anesthesia: General  Indication: 69 year old female who is 7 months status post L2-3 and L3-4 anterior lateral decompression and fusion utilizing interbody cages and posterior pedicle screw fixation.  Initially did well following surgery but has developed severe back and bilateral lower extremity pain.  Work-up demonstrates evidence of the new development of an unstable degenerative spondylolisthesis at L4-5 with new development of bilateral synovial cyst with compression of thecal sac and nerve roots.  The patient presents now for reexploration of her prior L2-L4 fusion with decompression and fusion and resection of her synovial cyst at the L4-5 level.  It is assumed that the fusions at L2-L4 are still somewhat immature and will require augmentation while everything is exposed.  Operative note: After induction anesthesia, patient position prone on the Wilson frame properly padded.  Lumbar region prepped and draped sterilely.  Incision made from L2-L5 and midline.  Dissection performed on the left side exposing the previously  placed pedicle screw instrumentation at L2-L3 and L4 as well as the lamina facet joints and transverse processes of L4 and L5 bilaterally.  Retractor placed.  Fluoroscopy used.  Levels confirmed.  Pedicle screw construct from L2-L4 was disassembled.  Rod was removed.  The fusion was inspected.  The instrumentation was still solid and secure.  Fusion appears to be healing well but I could not be certain that it was in fact solid.  At this point I determined that it would be beneficial to augment the fusion with additional bone grafting during the procedure.  Attention then placed to the L4-5 level.  Decompressive laminotomies and facetectomies were then performed using Leksell rongeurs Kerrison rongeurs and the high-speed drill.  The entire inferior facet of L4 was removed bilaterally.  The inferior two thirds of the lamina of L4 was removed bilaterally and the majority of the superior facet of L5 was removed bilaterally and the superior rim of the L5 lamina was removed bilaterally.  Ligament flavum was elevated and resected.  An adherent synovial cyst was encountered on both sides.  The microscope was used for microdissection and the cyst were completely resected relieving all compression upon the thecal sac and nerve roots.  Attention was then placed to the disc space.  Bilateral discectomies were then performed.  The space and then prepared for interbody fusion.  With a distractor placed the patient's right side the space was then further cleaned of all soft tissue.  A 9 mm Medtronic expandable cage packed with locally harvested autograft was then impacted in the place and expanded to its full extent.  Distractor removed patient's right side.  Disc space prepared on the right side.  Morselized autograft packed in the interspace.  A second cage packed with autograft was then impacted into place and expanded to its full extent.  Pedicles of L4 on  the right and L5 bilaterally were identified using surface landmarks  and intraoperative fluoroscopy and superficial bone around the pedicle was then removed using high-speed drill.  Pedicle was then probed using a pedicle awl.  Each pedicle tract was then probed and found to be solidly within the bone.  Each pedicle all track was then tapped with a screw tapped.  Each screw temple was probed and found to be solidly within the bone.  6.5 mm NuVasive screws were placed bilaterally at L5 and on the right side at L4.  Final images reveal good position of the cages and the hardware at the proper operative level with normal alignment of the spine.  A segment of titanium rod was then placed over the screw heads from L2-L3-L4 and L5 on the left side and at L4-5 on the right side.  Locking caps were then placed over the screws.  Locking caps were then engaged with a concentrator mild compression.  Lamina and facet joints of L2-3 and L3-4 were decorticated.  Morselized autograft and allograft packets were then placed overlying the decorticated bone on the left side for augmentation of her fusion.  Transverse processes of L4 and L5 were decorticated using high-speed drill.  Morselized autograft and allograft packets were packed bilaterally at L4-5.  Gelfoam was placed topically over the laminotomy defects.  Vancomycin powder was placed in the deep wound space.  Wound is then closed in layers of Vicryl sutures.  Steri-Strips and sterile dressing were applied.  No apparent complications.  The patient tolerated the procedure well and she returned to the recovery room postop.

## 2019-08-26 NOTE — Anesthesia Procedure Notes (Signed)
Procedure Name: Intubation Date/Time: 08/26/2019 10:50 AM Performed by: Darletta Moll, CRNA Pre-anesthesia Checklist: Patient identified, Emergency Drugs available, Suction available and Patient being monitored Patient Re-evaluated:Patient Re-evaluated prior to induction Oxygen Delivery Method: Circle system utilized Preoxygenation: Pre-oxygenation with 100% oxygen Induction Type: IV induction Ventilation: Mask ventilation without difficulty Laryngoscope Size: Mac and 3 Grade View: Grade I Tube type: Oral Tube size: 7.0 mm Number of attempts: 1 Airway Equipment and Method: Stylet and Oral airway Placement Confirmation: ETT inserted through vocal cords under direct vision,  positive ETCO2 and breath sounds checked- equal and bilateral Secured at: 22 cm Tube secured with: Tape Dental Injury: Teeth and Oropharynx as per pre-operative assessment

## 2019-08-26 NOTE — Evaluation (Signed)
Occupational Therapy Evaluation and Discharge Patient Details Name: Jean Delacruz MRN: [id number] DOB: [birth year] Today's Date: 08/26/2019    History of Present Illness Pt is s/p L2-3 and L34 anterior lateral interbody fusion with left-sided pedicle screw fixation from L2-L4   Clinical Impression   This 69 yo female admitted and underwent above presents to acute OT with all education completed with pt and husband. No further OT needs, we will sign off.    Follow Up Recommendations  No OT follow up;Supervision - Intermittent    Equipment Recommendations  None recommended by OT       Precautions / Restrictions Precautions Precautions: Back Precaution Booklet Issued: Yes (comment) Required Braces or Orthoses: Spinal Brace Spinal Brace: Applied in sitting position;Lumbar corset Restrictions Weight Bearing Restrictions: No      Mobility Bed Mobility Overal bed mobility: Needs Assistance Bed Mobility: Rolling;Sidelying to Sit;Sit to Sidelying Rolling: Supervision Sidelying to sit: Supervision     Sit to sidelying: Supervision General bed mobility comments: VCs for sequencing  Transfers Overall transfer level: Needs assistance   Transfers: Sit to/from Stand Sit to Stand: Min guard         General transfer comment: min A steps forward and back at bedside (no brace--husband went to get it from car)    Balance Overall balance assessment: Mild deficits observed, not formally tested                                         ADL either performed or assessed with clinical judgement   ADL                                         General ADL Comments: Pt and husband educated on sequence of dressing, positioning for dressing, use of wet wipes for back peri care, use of 2 cups for brushing teeth to avoid bending over the sink, positioning with pillows in bed, when brace should be off and when it should be worn     Vision Patient Visual  Report: No change from baseline              Pertinent Vitals/Pain Pain Assessment: Faces Faces Pain Scale: Hurts little more Pain Location: incisional Pain Descriptors / Indicators: Aching;Grimacing;Guarding;Sore Pain Intervention(s): Limited activity within patient's tolerance;Monitored during session;RN gave pain meds during session     Hand Dominance Right   Extremity/Trunk Assessment Upper Extremity Assessment Upper Extremity Assessment: Overall WFL for tasks assessed           Communication Communication Communication: No difficulties   Cognition Arousal/Alertness: Awake/alert Behavior During Therapy: WFL for tasks assessed/performed Overall Cognitive Status: Within Functional Limits for tasks assessed                                                Home Living Family/patient expects to be discharged to:: Private residence Living Arrangements: Spouse/significant other Available Help at Discharge: Family;Available 24 hours/day Type of Home: House Home Access: Stairs to enter [employer] of Steps: 2 Entrance Stairs-Rails: None Home Layout: Two level Alternate Level Stairs-Number of Steps: flight Alternate Level Stairs-Rails: Right;Left;Can reach both Bathroom Shower/Tub: Tub/shower unit   [employer]  Toilet: Standard     Home Equipment: None          Prior Functioning/Environment Level of Independence: Independent                 OT Problem List: Pain;Decreased range of motion;Impaired balance (sitting and/or standing)         OT Goals(Current goals can be found in the care plan section) Acute Rehab OT Goals Patient Stated Goal: hopeful for home tomorrow  OT Frequency:                AM-PAC OT "6 Clicks" Daily Activity     Outcome Measure Help from another person eating meals?: None Help from another person taking care of personal grooming?: A Little Help from another person toileting, which includes using  toliet, bedpan, or urinal?: A Little Help from another person bathing (including washing, rinsing, drying)?: A Lot Help from another person to put on and taking off regular upper body clothing?: A Little Help from another person to put on and taking off regular lower body clothing?: A Lot 6 Click Score: 17   End of Session Equipment Utilized During Treatment: Gait belt;Rolling walker Nurse Communication:  (no further OT needs)  Activity Tolerance: Patient tolerated treatment well Patient left: in bed;with call bell/phone within reach  OT Visit Diagnosis: Other abnormalities of gait and mobility (R26.89);Pain Pain - part of body:  (incisional)                Time: 6147-0929 OT Time Calculation (min): 34 min Charges:  OT General Charges $OT Visit: 1 Visit OT Evaluation $OT Eval Moderate Complexity: 1 Mod OT Treatments $Self Care/Home Management : 8-22 mins  Golden Circle, OTR/L Acute [employer] Pager [phone] Office [phone]     Almon Register 08/26/2019, 5:16 PM

## 2019-08-26 NOTE — Brief Op Note (Signed)
08/26/2019  1:00 PM  PATIENT:  Jean Delacruz  69 y.o. female  PRE-OPERATIVE DIAGNOSIS:  Synovial cyst  POST-OPERATIVE DIAGNOSIS:  Synovial cyst  PROCEDURE:  Procedure(s): Posterior Lumbar Interbody Fusion - Lumbar Four-Lumbar Five (N/A)  SURGEON:  Surgeon(s) and Role:    * Earnie Larsson, MD - Primary  PHYSICIAN ASSISTANT:   ASSISTANTSMearl Latin   ANESTHESIA:   general  EBL:  100 mL   BLOOD ADMINISTERED:none  DRAINS: none   LOCAL MEDICATIONS USED:  MARCAINE     SPECIMEN:  No Specimen  DISPOSITION OF SPECIMEN:  N/A  COUNTS:  YES  TOURNIQUET:  * No tourniquets in log *  DICTATION: .Dragon Dictation  PLAN OF CARE: Admit for overnight observation  PATIENT DISPOSITION:  PACU - hemodynamically stable.   Delay start of Pharmacological VTE agent (>24hrs) due to surgical blood loss or risk of bleeding: yes

## 2019-08-26 NOTE — Transfer of Care (Signed)
Immediate Anesthesia Transfer of Care Note  Patient: Jean Delacruz  Procedure(s) Performed: Posterior Lumbar Interbody Fusion - Lumbar Four-Lumbar Five (N/A Back)  Patient Location: PACU  Anesthesia Type:General  Level of Consciousness: drowsy and patient cooperative  Airway & Oxygen Therapy: Patient Spontanous Breathing and Patient connected to face mask oxygen  Post-op Assessment: Report given to RN, Post -op Vital signs reviewed and stable and Patient moving all extremities X 4  Post vital signs: Reviewed and stable  Last Vitals:  Vitals Value Taken Time  BP 148/70 08/26/19 1312  Temp    Pulse 79 08/26/19 1315  Resp 12 08/26/19 1315  SpO2 100 % 08/26/19 1315  Vitals shown include unvalidated device data.  Last Pain:  Vitals:   08/26/19 1030  TempSrc: Oral         Complications: No complications documented.

## 2019-08-26 NOTE — H&P (Signed)
Jean Delacruz is an 69 y.o. female.   Chief Complaint: Back and leg pain HPI: 69 year old female status post prior L2-3 and L3-4 anterior lateral interbody decompression and fusion with posterior percutaneous pedicle screw fixation has developed progressive back and bilateral lower extremity symptoms failing conservative management.  Work-up demonstrates evidence of an unstable degenerative spondylolisthesis at L4-5 with accompanying bilateral synovial cyst causing significant thecal sac and nerve root compression.  Patient has failed conservative management.  She presents now for L4-5 decompression and fusion in hopes of improving her symptoms.  Past Medical History:  Diagnosis Date  . Arthritis   . Chronic pain   . Headache    migraines  . Hypertension   . Hypothyroidism   . PONV (postoperative nausea and vomiting)     Past Surgical History:  Procedure Laterality Date  . ABDOMINAL HYSTERECTOMY    . ANTERIOR LATERAL LUMBAR FUSION WITH PERCUTANEOUS SCREW 2 LEVEL Left 02/21/2019   Procedure: Anterior Lateral Fusion Lumbar Two-Three/Lumbar Three-Four with unilateral percutaneous pedicle screws;  Surgeon: Earnie Larsson, MD;  Location: [facility];  Service: Neurosurgery;  Laterality: Left;  Anterior Lateral Fusion Lumbar Two-Three/Lumbar Three-Four with percutaneous pedicle screws  . BLADDER SURGERY  2007  . CESAREAN SECTION     x2  . CYST EXCISION     scalp  . galion cyst left hand x2    . NASAL SINUS SURGERY    . TONSILLECTOMY    . TUBAL LIGATION      History reviewed. No pertinent family history. Social History:  reports that she has never smoked. She has never used smokeless tobacco. She reports that she does not drink alcohol and does not use drugs.  Allergies: No Known Allergies  Medications Prior to Admission  Medication Sig Dispense Refill  . acetaminophen (TYLENOL) 500 MG tablet Take 1,000 mg by mouth every 6 (six) hours as needed for moderate pain or headache.    Marland Kitchen alum & mag  hydroxide-simeth (MAALOX/MYLANTA) 200-200-20 MG/5ML suspension Take 15 mLs by mouth every 6 (six) hours as needed for indigestion or heartburn.    . Carboxymethylcellul-Glycerin (LUBRICATING EYE DROPS OP) Place 1 drop into both eyes daily as needed (dry eyes).    . diazepam (VALIUM) 5 MG tablet Take 1-2 tablets (5-10 mg total) by mouth every 6 (six) hours as needed for muscle spasms. (Patient taking differently: Take 5 mg by mouth every 8 (eight) hours as needed for muscle spasms. ) 30 tablet 0  . estradiol (ESTRACE) 0.5 MG tablet Take 0.5 mg by mouth daily.     . fluticasone (FLONASE) 50 MCG/ACT nasal spray Place 2 sprays into both nostrils daily as needed for allergies or rhinitis.    Marland Kitchen gabapentin (NEURONTIN) 600 MG tablet Take 600 mg by mouth 3 (three) times daily.   3  . levothyroxine (SYNTHROID, LEVOTHROID) 25 MCG tablet Take 25 mcg by mouth daily before breakfast.     . lisinopril-hydrochlorothiazide (ZESTORETIC) 20-25 MG tablet Take 1 tablet by mouth daily.     . Melatonin 10 MG TABS Take 10 mg by mouth at bedtime.     . Multiple Vitamins-Minerals (PRESERVISION AREDS 2) CAPS Take 1 capsule by mouth 2 (two) times daily.    . naproxen (NAPROSYN) 500 MG tablet Take 500 mg by mouth 2 (two) times daily with a meal.   12  . oxyCODONE-acetaminophen (PERCOCET/ROXICET) 5-325 MG tablet Take 1 tablet by mouth every 6 (six) hours as needed for severe pain.    . pravastatin (PRAVACHOL)  20 MG tablet Take 20 mg by mouth at bedtime.   4  . traZODone (DESYREL) 150 MG tablet Take 150 mg by mouth at bedtime.       Results for orders placed or performed during the hospital encounter of 08/24/19 (from the past 48 hour(s))  SARS CORONAVIRUS 2 (TAT 6-24 HRS) Nasopharyngeal Nasopharyngeal Swab     Status: None   Collection Time: 08/24/19  1:06 PM   Specimen: Nasopharyngeal Swab  Result Value Ref Range   SARS Coronavirus 2 NEGATIVE NEGATIVE    Comment: (NOTE) SARS-CoV-2 target nucleic acids are NOT  DETECTED.  The SARS-CoV-2 RNA is generally detectable in upper and lower respiratory specimens during the acute phase of infection. Negative results do not preclude SARS-CoV-2 infection, do not rule out co-infections with other pathogens, and should not be used as the sole basis for treatment or other patient management decisions. Negative results must be combined with clinical observations, patient history, and epidemiological information. The expected result is Negative.  Fact Sheet for Patients: SugarRoll.be  Fact Sheet for Healthcare Providers: https://www.woods-mathews.com/  This test is not yet approved or cleared by the [place] FDA and  has been authorized for detection and/or diagnosis of SARS-CoV-2 by FDA under an Emergency Use Authorization (EUA). This EUA will remain  in effect (meaning this test can be used) for the duration of the COVID-19 declaration under Se ction 564(b)(1) of the Act, 21 U.S.C. section 360bbb-3(b)(1), unless the authorization is terminated or revoked sooner.  Performed at [facility], [place] [address] [place], [place] [address]    No results found.  Pertinent items noted in HPI and remainder of comprehensive ROS otherwise negative.  There were no vitals taken for this visit.  Patient is awake and alert.  She is oriented and appropriate.  Speech is fluent.  Judgment insight are intact.  Cranial nerve function normal bilateral.  Motor examination reveals some mild weakness of dorsiflexion bilaterally otherwise motor strength intact.  Sensory examination some decrease sensation pinprick light touch in her L5 and S1 dermatomes bilateral.  Deep tendon flexes normal active scepter Achilles reflexes are absent.  Gait is antalgic.  Posture is mildly flexed peer examination head ears eyes nose and throat is unremarkable her chest and abdomen are benign.  Extremities are free from injury  deformity. Assessment/Plan L4-5 unstable degenerative spondylolisthesis with associated adherent bilateral synovial cysts causing back pain and radiculopathy.  Plan bilateral L4-5 decompressive laminotomies and resection of synovial cyst followed by posterior lumbar interbody fusion utilizing interbody cages, locally harvested autograft, and augmented with posterior lateral fusion with pedicle screw fixation.  Risks and benefits been explained.  Patient wishes to proceed with surgery.  Mallie Mussel A Musette Kisamore 08/26/2019, 10:24 AM

## 2019-08-26 NOTE — Anesthesia Postprocedure Evaluation (Signed)
Anesthesia Post Note  Patient: Jean Delacruz  Procedure(s) Performed: Posterior Lumbar Interbody Fusion - Lumbar Four-Lumbar Five (N/A Back)     Patient location during evaluation: PACU Anesthesia Type: General Level of consciousness: awake and alert and oriented Pain management: pain level controlled Vital Signs Assessment: post-procedure vital signs reviewed and stable Respiratory status: spontaneous breathing, nonlabored ventilation and respiratory function stable Cardiovascular status: blood pressure returned to baseline and stable Postop Assessment: no apparent nausea or vomiting Anesthetic complications: no   No complications documented.  Last Vitals:  Vitals:   08/26/19 1441 08/26/19 1445  BP: 100/85 (!) 101/56  Pulse: 85 84  Resp: 16 15  Temp:  (!) 36.4 C  SpO2: 93% 99%    Last Pain:  Vitals:   08/26/19 1445  TempSrc:   PainSc: 3                  Rangel Echeverri A.

## 2019-08-27 DIAGNOSIS — M4316 Spondylolisthesis, lumbar region: Secondary | ICD-10-CM | POA: Diagnosis not present

## 2019-08-27 NOTE — Progress Notes (Signed)
Patient is transferred from room 3C04 to unit 5N10 at this time. Alert and in stable condition. Report given to receiving nurse Dondra Spry, RN with all questions answered. Left unit via wheelchair with all belongings at side.

## 2019-08-27 NOTE — Evaluation (Signed)
Physical Therapy Evaluation Patient Details Name: Jean Delacruz MRN: [id number] DOB: 05-Jan-1951 Today's Date: 08/27/2019   History of Present Illness  Pt is a 69 y/o female s/p L2-3 and L3-4 anterior lateral interbody fusion with left-sided pedicle screw fixation from L2-L4  Clinical Impression  Pt presented supine in bed with HOB elevated, awake and willing to participate in therapy session. Prior to admission, pt reported that she was independent with all functional mobility and ADLs. Pt lives with her husband in a two level home with two steps to enter. At the time of evaluation, pt overall moving well despite increased pain. She tolerated hallway ambulation without use of an AD or need for physical assistance. Additionally, she participated in stair training this session as well. PT provided pt education re: back precautions, car transfers and a generalized walking program for pt to initiate upon d/c home. No further acute PT needs identified at this time. PT signing off.     Follow Up Recommendations No PT follow up    Equipment Recommendations  None recommended by PT    Recommendations for Other Services       Precautions / Restrictions Precautions Precautions: Back Precaution Comments: reviewed 3/3 back precautions with pt throughout Required Braces or Orthoses: Spinal Brace Spinal Brace: Applied in sitting position;Lumbar corset Restrictions Weight Bearing Restrictions: No      Mobility  Bed Mobility Overal bed mobility: Needs Assistance Bed Mobility: Rolling;Sidelying to Sit;Sit to Sidelying Rolling: Supervision Sidelying to sit: Supervision     Sit to sidelying: Min assist General bed mobility comments: good technique utilized, min A to return bilateral LEs onto bed  Transfers Overall transfer level: Needs assistance Equipment used: None Transfers: Sit to/from Stand Sit to Stand: Supervision         General transfer comment: for safety, no instability  noted, slow movement into standing from EOB  Ambulation/Gait Ambulation/Gait assistance: Supervision Gait Distance (Feet): 500 Feet Assistive device: None Gait Pattern/deviations: Step-through pattern;Decreased stride length Gait velocity: decreased   General Gait Details: no instability or LOB, no need for physical assistance or use of an AD  Stairs Stairs: Yes Stairs assistance: Min guard Stair Management: One rail Left;Step to pattern;Forwards Number of Stairs: 2 General stair comments: increased pain, but no difficulties performing otherwise  Wheelchair Mobility    Modified Rankin (Stroke Patients Only)       Balance Overall balance assessment: Mild deficits observed, not formally tested                                           Pertinent Vitals/Pain Pain Assessment: Faces Faces Pain Scale: Hurts little more Pain Location: incisional Pain Descriptors / Indicators: Aching;Grimacing;Guarding;Sore Pain Intervention(s): Monitored during session;Repositioned    Home Living Family/patient expects to be discharged to:: Private residence Living Arrangements: Spouse/significant other Available Help at Discharge: Family;Available 24 hours/day Type of Home: House Home Access: Stairs to enter Entrance Stairs-Rails: None Entrance Stairs-Number of Steps: 2 Home Layout: Two level Home Equipment: None      Prior Function Level of Independence: Independent               Hand Dominance        Extremity/Trunk Assessment   Upper Extremity Assessment Upper Extremity Assessment: Overall WFL for tasks assessed    Lower Extremity Assessment Lower Extremity Assessment: Overall WFL for tasks assessed    Cervical /  Trunk Assessment Cervical / Trunk Assessment: Other exceptions Cervical / Trunk Exceptions: s/p lumbar sx  Communication   Communication: No difficulties  Cognition Arousal/Alertness: Awake/alert Behavior During Therapy: WFL for  tasks assessed/performed Overall Cognitive Status: Within Functional Limits for tasks assessed                                        General Comments      Exercises     Assessment/Plan    PT Assessment Patent does not need any further PT services  PT Problem List         PT Treatment Interventions      PT Goals (Current goals can be found in the Care Plan section)  Acute Rehab PT Goals Patient Stated Goal: "home today" PT Goal Formulation: All assessment and education complete, DC therapy    Frequency     Barriers to discharge        Co-evaluation               AM-PAC PT "6 Clicks" Mobility  Outcome Measure Help needed turning from your back to your side while in a flat bed without using bedrails?: None Help needed moving from lying on your back to sitting on the side of a flat bed without using bedrails?: None Help needed moving to and from a bed to a chair (including a wheelchair)?: None Help needed standing up from a chair using your arms (e.g., wheelchair or bedside chair)?: None Help needed to walk in hospital room?: None Help needed climbing 3-5 steps with a railing? : None 6 Click Score: 24    End of Session Equipment Utilized During Treatment: Back brace Activity Tolerance: Patient tolerated treatment well Patient left: in bed;with call bell/phone within reach Nurse Communication: Mobility status PT Visit Diagnosis: Other abnormalities of gait and mobility (R26.89)    Time: 6433-2951 PT Time Calculation (min) (ACUTE ONLY): 19 min   Charges:   PT Evaluation $PT Eval Low Complexity: 1 Low          Eduard Clos, PT, DPT  Acute Rehabilitation Services Pager [phone] Office [place] 08/27/2019, 9:44 AM

## 2019-08-28 DIAGNOSIS — M4316 Spondylolisthesis, lumbar region: Secondary | ICD-10-CM | POA: Diagnosis not present

## 2019-08-28 MED ORDER — OXYCODONE HCL 10 MG PO TABS: 10.0000 mg | ORAL_TABLET | ORAL | 0 refills | Status: DC | PRN

## 2019-08-28 MED ORDER — DIAZEPAM 5 MG PO TABS: 5.0000 mg | ORAL_TABLET | Freq: Four times a day (QID) | ORAL | 0 refills | Status: DC | PRN

## 2019-08-28 NOTE — Plan of Care (Signed)
  Problem: Safety: Goal: Ability to remain free from injury will improve Outcome: Adequate for Discharge   Problem: Education: Goal: Ability to verbalize activity precautions or restrictions will improve Outcome: Adequate for Discharge Goal: Knowledge of the prescribed therapeutic regimen will improve Outcome: Adequate for Discharge Goal: Understanding of discharge needs will improve Outcome: Adequate for Discharge   Problem: Activity: Goal: Ability to avoid complications of mobility impairment will improve Outcome: Adequate for Discharge Goal: Ability to tolerate increased activity will improve Outcome: Adequate for Discharge Goal: Will remain free from falls Outcome: Adequate for Discharge   Problem: Bowel/Gastric: Goal: Gastrointestinal status for postoperative course will improve Outcome: Adequate for Discharge   Problem: Clinical Measurements: Goal: Ability to maintain clinical measurements within normal limits will improve Outcome: Adequate for Discharge Goal: Postoperative complications will be avoided or minimized Outcome: Adequate for Discharge Goal: Diagnostic test results will improve Outcome: Adequate for Discharge   Problem: Pain Management: Goal: Pain level will decrease Outcome: Adequate for Discharge   Problem: Skin Integrity: Goal: Will show signs of wound healing Outcome: Adequate for Discharge   Problem: Health Behavior/Discharge Planning: Goal: Identification of resources available to assist in meeting health care needs will improve Outcome: Adequate for Discharge   Problem: Bladder/Genitourinary: Goal: Urinary functional status for postoperative course will improve Outcome: Adequate for Discharge   

## 2019-08-28 NOTE — Progress Notes (Signed)
Patient discharged to home via private vehicle with husband. All questions and concerns were addressed. Discharge instructions provided and handed to the patient.

## 2019-08-28 NOTE — Discharge Summary (Signed)
Physician Discharge Summary  Patient ID: Jean Delacruz MRN: [id number] DOB/AGE: 70/20/1952 70 y.o.  Admit date: 08/26/2019 Discharge date: 08/28/2019  Admission Diagnoses:  Discharge Diagnoses:  Active Problems:   Synovial cyst of lumbar facet joint   Discharged Condition: good  Hospital Course: Patient admitted to the hospital where she underwent uncomplicated lumbar decompression and fusion.  Postop Truman Hayward doing reasonably well.  Having the expected amount of back pain.  Up ambulating without difficulty.  Voiding well.  Motor and sensory function intact.  Consults:   Significant Diagnostic Studies:   Treatments:   Discharge Exam: Blood pressure (!) 118/54, pulse 91, temperature (!) 97.5 F (36.4 C), temperature source Oral, resp. rate 16, height 5\' 2"  (1.575 m), weight 54 kg, SpO2 100 %. Awake and alert.  Oriented and appropriate.  Motor and sensory function intact.  Wound clean and dry.  Chest and abdomen benign.  Disposition: Discharge disposition: 01-Home or Self Care        Allergies as of 08/28/2019   No Known Allergies     Medication List    STOP taking these medications   oxyCODONE-acetaminophen 5-325 MG tablet Commonly known as: PERCOCET/ROXICET     TAKE these medications   acetaminophen 500 MG tablet Commonly known as: TYLENOL Take 1,000 mg by mouth every 6 (six) hours as needed for moderate pain or headache.   alum & mag hydroxide-simeth 200-200-20 MG/5ML suspension Commonly known as: MAALOX/MYLANTA Take 15 mLs by mouth every 6 (six) hours as needed for indigestion or heartburn.   diazepam 5 MG tablet Commonly known as: VALIUM Take 1-2 tablets (5-10 mg total) by mouth every 6 (six) hours as needed for muscle spasms. What changed:   how much to take  when to take this   estradiol 0.5 MG tablet Commonly known as: ESTRACE Take 0.5 mg by mouth daily.   fluticasone 50 MCG/ACT nasal spray Commonly known as: FLONASE Place 2 sprays into both  nostrils daily as needed for allergies or rhinitis.   gabapentin 600 MG tablet Commonly known as: NEURONTIN Take 600 mg by mouth 3 (three) times daily.   levothyroxine 25 MCG tablet Commonly known as: SYNTHROID Take 25 mcg by mouth daily before breakfast.   lisinopril-hydrochlorothiazide 20-25 MG tablet Commonly known as: ZESTORETIC Take 1 tablet by mouth daily.   LUBRICATING EYE DROPS OP Place 1 drop into both eyes daily as needed (dry eyes).   Melatonin 10 MG Tabs Take 10 mg by mouth at bedtime.   naproxen 500 MG tablet Commonly known as: NAPROSYN Take 500 mg by mouth 2 (two) times daily with a meal.   Oxycodone HCl 10 MG Tabs Take 1 tablet (10 mg total) by mouth every 3 (three) hours as needed for severe pain ((score 7 to 10)).   pravastatin 20 MG tablet Commonly known as: PRAVACHOL Take 20 mg by mouth at bedtime.   PreserVision AREDS 2 Caps Take 1 capsule by mouth 2 (two) times daily.   traZODone 150 MG tablet Commonly known as: DESYREL Take 150 mg by mouth at bedtime.            Durable Medical Equipment  (From admission, onward)         Start     Ordered   08/26/19 1506  DME Walker rolling  Once       Question:  Patient needs a walker to treat with the following condition  Answer:  Synovial cyst of lumbar facet joint   08/26/19 1505  08/26/19 1506  DME 3 n 1  Once        08/26/19 1505           Signed: Cooper Render Suleika Donavan 08/28/2019, 6:58 PM

## 2019-08-28 NOTE — Discharge Instructions (Signed)

## 2019-08-28 NOTE — Plan of Care (Signed)
°  Problem: Safety: Goal: Ability to remain free from injury will improve Outcome: Progressing   Problem: Safety: Goal: Ability to remain free from injury will improve Outcome: Progressing   Problem: Safety: Goal: Ability to remain free from injury will improve Outcome: Progressing   Problem: Safety: Goal: Ability to remain free from injury will improve Outcome: Progressing   Problem: Safety: Goal: Ability to remain free from injury will improve Outcome: Progressing   Problem: Safety: Goal: Ability to remain free from injury will improve Outcome: Progressing

## 2019-09-01 MED FILL — Heparin Sodium (Porcine) Inj 1000 Unit/ML: INTRAMUSCULAR | Qty: 30 | Status: AC

## 2019-09-01 MED FILL — Sodium Chloride IV Soln 0.9%: INTRAVENOUS | Qty: 1000 | Status: AC

## 2019-09-06 DIAGNOSIS — M4310 Spondylolisthesis, site unspecified: Secondary | ICD-10-CM | POA: Insufficient documentation

## 2019-09-06 DIAGNOSIS — Z681 Body mass index (BMI) 19 or less, adult: Secondary | ICD-10-CM | POA: Insufficient documentation

## 2019-09-16 DIAGNOSIS — R300 Dysuria: Secondary | ICD-10-CM | POA: Diagnosis not present

## 2019-09-16 DIAGNOSIS — N390 Urinary tract infection, site not specified: Secondary | ICD-10-CM | POA: Diagnosis not present

## 2019-09-27 DIAGNOSIS — M4310 Spondylolisthesis, site unspecified: Secondary | ICD-10-CM | POA: Diagnosis not present

## 2019-09-30 DIAGNOSIS — N390 Urinary tract infection, site not specified: Secondary | ICD-10-CM | POA: Diagnosis not present

## 2019-09-30 DIAGNOSIS — S90822A Blister (nonthermal), left foot, initial encounter: Secondary | ICD-10-CM | POA: Diagnosis not present

## 2019-10-03 ENCOUNTER — Encounter: Payer: Self-pay | Admitting: Podiatry

## 2019-10-03 ENCOUNTER — Other Ambulatory Visit: Payer: Self-pay | Admitting: Podiatry

## 2019-10-03 ENCOUNTER — Other Ambulatory Visit: Payer: Self-pay

## 2019-10-03 ENCOUNTER — Ambulatory Visit: Payer: PPO | Admitting: Podiatry

## 2019-10-03 DIAGNOSIS — L089 Local infection of the skin and subcutaneous tissue, unspecified: Secondary | ICD-10-CM | POA: Diagnosis not present

## 2019-10-03 DIAGNOSIS — S90822A Blister (nonthermal), left foot, initial encounter: Secondary | ICD-10-CM

## 2019-10-03 DIAGNOSIS — T148XXA Other injury of unspecified body region, initial encounter: Secondary | ICD-10-CM

## 2019-10-03 DIAGNOSIS — G8929 Other chronic pain: Secondary | ICD-10-CM | POA: Insufficient documentation

## 2019-10-03 DIAGNOSIS — M961 Postlaminectomy syndrome, not elsewhere classified: Secondary | ICD-10-CM | POA: Insufficient documentation

## 2019-10-03 NOTE — Progress Notes (Signed)
  Subjective:  Patient ID: Jean Delacruz, female    DOB: Jan 19, 1951,  MRN: [id number]  Chief Complaint  Patient presents with  . Blister    Rt back bottom heel blister x Last wed; sore in different plaes; 8/10 when pressure is applied no injuyr/redness/swelling/draiange -no popped Tcx: none     69 y.o. female presents with the above complaint. History confirmed with patient.   Objective:  Physical Exam: warm, good capillary refill, no trophic changes or ulcerative lesions, normal DP and PT pulses and normal sensory exam. Left Foot: Large bulla left heel with serous drainage. Wound base intact. No e/o DTI.  Assessment:   1. Infected blister of left foot, initial encounter      Plan:  Patient was evaluated and treated and all questions answered.  Left heel blister, ?DTI -Blister incised and deroofed. Dressed with povidone ointment and dressing -Soak daily -F/u in 2 weeks for recheck -Float heels at all times.  Return in about 2 weeks (around 10/17/2019) for Blister f/u .

## 2019-10-03 NOTE — Patient Instructions (Signed)
Soak Instructions    THE DAY AFTER THE PROCEDURE  Place 1/4 cup of epsom salts in a quart of warm tap water. Soak in the solution for 20 minutes.  This soak should be done twice a day.  Next, remove your foot or feet from solution, blot dry the affected area and cover.  You may use a band aid large enough to cover the area or use gauze and tape.  Apply other medications to the area as directed by the doctor such as betadine ointment.  IF YOUR SKIN BECOMES IRRITATED WHILE USING THESE INSTRUCTIONS, IT IS OKAY TO SWITCH TO  WHITE VINEGAR AND WATER. Or you may use antibacterial soap and water to keep the toe clean  Monitor for any signs/symptoms of infection. Call the office immediately if any occur or go directly to the emergency room. Call with any questions/concern

## 2019-10-17 ENCOUNTER — Ambulatory Visit: Payer: PPO | Admitting: Podiatry

## 2019-10-17 ENCOUNTER — Other Ambulatory Visit: Payer: Self-pay

## 2019-10-17 DIAGNOSIS — L089 Local infection of the skin and subcutaneous tissue, unspecified: Secondary | ICD-10-CM

## 2019-10-17 DIAGNOSIS — S90822D Blister (nonthermal), left foot, subsequent encounter: Secondary | ICD-10-CM | POA: Diagnosis not present

## 2019-10-17 NOTE — Progress Notes (Signed)
  Subjective:  Patient ID: Jean Delacruz, female    DOB: 1950-07-09,  MRN: [id number]  Chief Complaint  Patient presents with  . Blister    F/U Lt heel blister Pt states," I think it's better." - no redness/draiange Tx: epsom salt, and povidone w/ dressind dialy   . Foot Swelling    Lt foot swelling x sat, no injuyr/pain Tx; none     70 y.o. female presents with the above complaint. History confirmed with patient.   Objective:  Physical Exam: warm, good capillary refill, no trophic changes or ulcerative lesions, normal DP and PT pulses and normal sensory exam. Left Foot: Healed blister left heel. Pitting edema foot and legs Right Foot: slight pitting edema foot and legs  Assessment:   1. Infected blister of left foot, subsequent encounter    Plan:  Patient was evaluated and treated and all questions answered.  Left heel blister, ?DTI -Blister healed. No recurrence -She does have edema, advised she talked to her PCP about possible diuretics -Tubigrip dispensed bilat  No follow-ups on file.

## 2019-10-27 DIAGNOSIS — M4310 Spondylolisthesis, site unspecified: Secondary | ICD-10-CM | POA: Diagnosis not present

## 2019-11-23 DIAGNOSIS — M4310 Spondylolisthesis, site unspecified: Secondary | ICD-10-CM | POA: Diagnosis not present

## 2019-11-29 DIAGNOSIS — M4316 Spondylolisthesis, lumbar region: Secondary | ICD-10-CM | POA: Diagnosis not present

## 2019-11-29 DIAGNOSIS — M545 Low back pain, unspecified: Secondary | ICD-10-CM | POA: Diagnosis not present

## 2019-12-08 DIAGNOSIS — M545 Low back pain, unspecified: Secondary | ICD-10-CM | POA: Diagnosis not present

## 2019-12-08 DIAGNOSIS — M4316 Spondylolisthesis, lumbar region: Secondary | ICD-10-CM | POA: Diagnosis not present

## 2019-12-13 DIAGNOSIS — M4316 Spondylolisthesis, lumbar region: Secondary | ICD-10-CM | POA: Diagnosis not present

## 2019-12-13 DIAGNOSIS — M545 Low back pain, unspecified: Secondary | ICD-10-CM | POA: Diagnosis not present

## 2019-12-15 DIAGNOSIS — F331 Major depressive disorder, recurrent, moderate: Secondary | ICD-10-CM | POA: Diagnosis not present

## 2019-12-15 DIAGNOSIS — R110 Nausea: Secondary | ICD-10-CM | POA: Diagnosis not present

## 2019-12-20 DIAGNOSIS — Z981 Arthrodesis status: Secondary | ICD-10-CM | POA: Insufficient documentation

## 2019-12-21 DIAGNOSIS — M545 Low back pain, unspecified: Secondary | ICD-10-CM | POA: Diagnosis not present

## 2019-12-21 DIAGNOSIS — M4316 Spondylolisthesis, lumbar region: Secondary | ICD-10-CM | POA: Diagnosis not present

## 2019-12-27 DIAGNOSIS — M545 Low back pain, unspecified: Secondary | ICD-10-CM | POA: Diagnosis not present

## 2019-12-27 DIAGNOSIS — M4316 Spondylolisthesis, lumbar region: Secondary | ICD-10-CM | POA: Diagnosis not present

## 2020-01-05 DIAGNOSIS — M4316 Spondylolisthesis, lumbar region: Secondary | ICD-10-CM | POA: Diagnosis not present

## 2020-01-05 DIAGNOSIS — M545 Low back pain, unspecified: Secondary | ICD-10-CM | POA: Diagnosis not present

## 2020-01-09 DIAGNOSIS — M47816 Spondylosis without myelopathy or radiculopathy, lumbar region: Secondary | ICD-10-CM | POA: Diagnosis not present

## 2020-01-09 DIAGNOSIS — F411 Generalized anxiety disorder: Secondary | ICD-10-CM | POA: Diagnosis not present

## 2020-01-09 DIAGNOSIS — M961 Postlaminectomy syndrome, not elsewhere classified: Secondary | ICD-10-CM | POA: Diagnosis not present

## 2020-01-10 DIAGNOSIS — G4700 Insomnia, unspecified: Secondary | ICD-10-CM | POA: Diagnosis not present

## 2020-01-10 DIAGNOSIS — E785 Hyperlipidemia, unspecified: Secondary | ICD-10-CM | POA: Diagnosis not present

## 2020-01-10 DIAGNOSIS — F411 Generalized anxiety disorder: Secondary | ICD-10-CM | POA: Diagnosis not present

## 2020-01-10 DIAGNOSIS — Z6820 Body mass index (BMI) 20.0-20.9, adult: Secondary | ICD-10-CM | POA: Diagnosis not present

## 2020-01-10 DIAGNOSIS — E039 Hypothyroidism, unspecified: Secondary | ICD-10-CM | POA: Diagnosis not present

## 2020-01-10 DIAGNOSIS — R300 Dysuria: Secondary | ICD-10-CM | POA: Diagnosis not present

## 2020-01-10 DIAGNOSIS — Z23 Encounter for immunization: Secondary | ICD-10-CM | POA: Diagnosis not present

## 2020-01-10 DIAGNOSIS — I10 Essential (primary) hypertension: Secondary | ICD-10-CM | POA: Diagnosis not present

## 2020-01-10 DIAGNOSIS — F331 Major depressive disorder, recurrent, moderate: Secondary | ICD-10-CM | POA: Diagnosis not present

## 2020-01-10 DIAGNOSIS — G8929 Other chronic pain: Secondary | ICD-10-CM | POA: Diagnosis not present

## 2020-01-10 DIAGNOSIS — R634 Abnormal weight loss: Secondary | ICD-10-CM | POA: Diagnosis not present

## 2020-01-10 DIAGNOSIS — M545 Low back pain, unspecified: Secondary | ICD-10-CM | POA: Diagnosis not present

## 2020-01-11 DIAGNOSIS — M4316 Spondylolisthesis, lumbar region: Secondary | ICD-10-CM | POA: Diagnosis not present

## 2020-01-11 DIAGNOSIS — M545 Low back pain, unspecified: Secondary | ICD-10-CM | POA: Diagnosis not present

## 2020-01-23 DIAGNOSIS — G8929 Other chronic pain: Secondary | ICD-10-CM | POA: Diagnosis not present

## 2020-01-23 DIAGNOSIS — M545 Low back pain, unspecified: Secondary | ICD-10-CM | POA: Diagnosis not present

## 2020-01-25 ENCOUNTER — Other Ambulatory Visit: Payer: Self-pay | Admitting: Neurosurgery

## 2020-01-25 DIAGNOSIS — I10 Essential (primary) hypertension: Secondary | ICD-10-CM | POA: Diagnosis not present

## 2020-01-25 DIAGNOSIS — M961 Postlaminectomy syndrome, not elsewhere classified: Secondary | ICD-10-CM

## 2020-01-27 DIAGNOSIS — I10 Essential (primary) hypertension: Secondary | ICD-10-CM | POA: Diagnosis not present

## 2020-01-27 DIAGNOSIS — M47816 Spondylosis without myelopathy or radiculopathy, lumbar region: Secondary | ICD-10-CM | POA: Diagnosis not present

## 2020-02-09 DIAGNOSIS — Z681 Body mass index (BMI) 19 or less, adult: Secondary | ICD-10-CM | POA: Diagnosis not present

## 2020-02-09 DIAGNOSIS — E559 Vitamin D deficiency, unspecified: Secondary | ICD-10-CM | POA: Diagnosis not present

## 2020-02-09 DIAGNOSIS — F331 Major depressive disorder, recurrent, moderate: Secondary | ICD-10-CM | POA: Diagnosis not present

## 2020-02-09 DIAGNOSIS — M545 Low back pain, unspecified: Secondary | ICD-10-CM | POA: Diagnosis not present

## 2020-02-09 DIAGNOSIS — G8929 Other chronic pain: Secondary | ICD-10-CM | POA: Diagnosis not present

## 2020-02-09 DIAGNOSIS — F411 Generalized anxiety disorder: Secondary | ICD-10-CM | POA: Diagnosis not present

## 2020-02-09 DIAGNOSIS — R634 Abnormal weight loss: Secondary | ICD-10-CM | POA: Diagnosis not present

## 2020-02-14 ENCOUNTER — Ambulatory Visit
Admission: RE | Admit: 2020-02-14 | Discharge: 2020-02-14 | Disposition: A | Discharge status: Home / Self Care | Payer: PPO | Source: Ambulatory Visit | Attending: Neurosurgery | Admitting: Neurosurgery

## 2020-02-14 DIAGNOSIS — M961 Postlaminectomy syndrome, not elsewhere classified: Secondary | ICD-10-CM

## 2020-02-14 DIAGNOSIS — M545 Low back pain, unspecified: Secondary | ICD-10-CM | POA: Diagnosis not present

## 2020-02-22 DIAGNOSIS — I10 Essential (primary) hypertension: Secondary | ICD-10-CM | POA: Diagnosis not present

## 2020-02-22 DIAGNOSIS — Z981 Arthrodesis status: Secondary | ICD-10-CM | POA: Diagnosis not present

## 2020-03-09 DIAGNOSIS — M461 Sacroiliitis, not elsewhere classified: Secondary | ICD-10-CM | POA: Diagnosis not present

## 2020-04-18 DIAGNOSIS — I10 Essential (primary) hypertension: Secondary | ICD-10-CM | POA: Diagnosis not present

## 2020-04-18 DIAGNOSIS — Z981 Arthrodesis status: Secondary | ICD-10-CM | POA: Diagnosis not present

## 2020-04-24 DIAGNOSIS — G4700 Insomnia, unspecified: Secondary | ICD-10-CM | POA: Diagnosis not present

## 2020-04-24 DIAGNOSIS — Z79891 Long term (current) use of opiate analgesic: Secondary | ICD-10-CM | POA: Diagnosis not present

## 2020-04-24 DIAGNOSIS — Z0189 Encounter for other specified special examinations: Secondary | ICD-10-CM | POA: Diagnosis not present

## 2020-04-24 DIAGNOSIS — G8929 Other chronic pain: Secondary | ICD-10-CM | POA: Diagnosis not present

## 2020-04-24 DIAGNOSIS — M961 Postlaminectomy syndrome, not elsewhere classified: Secondary | ICD-10-CM | POA: Insufficient documentation

## 2020-05-08 DIAGNOSIS — Z1231 Encounter for screening mammogram for malignant neoplasm of breast: Secondary | ICD-10-CM | POA: Diagnosis not present

## 2020-05-09 DIAGNOSIS — Z9181 History of falling: Secondary | ICD-10-CM | POA: Diagnosis not present

## 2020-05-09 DIAGNOSIS — E785 Hyperlipidemia, unspecified: Secondary | ICD-10-CM | POA: Diagnosis not present

## 2020-05-09 DIAGNOSIS — Z0000 Encounter for general adult medical examination without abnormal findings: Secondary | ICD-10-CM | POA: Diagnosis not present

## 2020-05-09 DIAGNOSIS — Z1331 Encounter for screening for depression: Secondary | ICD-10-CM | POA: Diagnosis not present

## 2020-05-15 DIAGNOSIS — F331 Major depressive disorder, recurrent, moderate: Secondary | ICD-10-CM | POA: Diagnosis not present

## 2020-05-15 DIAGNOSIS — Z6820 Body mass index (BMI) 20.0-20.9, adult: Secondary | ICD-10-CM | POA: Diagnosis not present

## 2020-05-15 DIAGNOSIS — R634 Abnormal weight loss: Secondary | ICD-10-CM | POA: Diagnosis not present

## 2020-05-15 DIAGNOSIS — G8929 Other chronic pain: Secondary | ICD-10-CM | POA: Diagnosis not present

## 2020-05-15 DIAGNOSIS — E559 Vitamin D deficiency, unspecified: Secondary | ICD-10-CM | POA: Diagnosis not present

## 2020-05-15 DIAGNOSIS — Z0289 Encounter for other administrative examinations: Secondary | ICD-10-CM | POA: Insufficient documentation

## 2020-05-15 DIAGNOSIS — Z79899 Other long term (current) drug therapy: Secondary | ICD-10-CM | POA: Diagnosis not present

## 2020-05-15 DIAGNOSIS — F411 Generalized anxiety disorder: Secondary | ICD-10-CM | POA: Diagnosis not present

## 2020-05-15 DIAGNOSIS — I10 Essential (primary) hypertension: Secondary | ICD-10-CM | POA: Diagnosis not present

## 2020-05-15 DIAGNOSIS — N951 Menopausal and female climacteric states: Secondary | ICD-10-CM | POA: Diagnosis not present

## 2020-05-15 DIAGNOSIS — Z760 Encounter for issue of repeat prescription: Secondary | ICD-10-CM | POA: Diagnosis not present

## 2020-05-15 DIAGNOSIS — M961 Postlaminectomy syndrome, not elsewhere classified: Secondary | ICD-10-CM | POA: Diagnosis not present

## 2020-05-15 DIAGNOSIS — Z79891 Long term (current) use of opiate analgesic: Secondary | ICD-10-CM | POA: Diagnosis not present

## 2020-05-15 DIAGNOSIS — M545 Low back pain, unspecified: Secondary | ICD-10-CM | POA: Diagnosis not present

## 2020-05-15 DIAGNOSIS — G4700 Insomnia, unspecified: Secondary | ICD-10-CM | POA: Diagnosis not present

## 2020-05-15 DIAGNOSIS — E785 Hyperlipidemia, unspecified: Secondary | ICD-10-CM | POA: Diagnosis not present

## 2020-06-12 DIAGNOSIS — G4700 Insomnia, unspecified: Secondary | ICD-10-CM | POA: Diagnosis not present

## 2020-06-12 DIAGNOSIS — Z79891 Long term (current) use of opiate analgesic: Secondary | ICD-10-CM | POA: Diagnosis not present

## 2020-06-12 DIAGNOSIS — M961 Postlaminectomy syndrome, not elsewhere classified: Secondary | ICD-10-CM | POA: Diagnosis not present

## 2020-07-24 DIAGNOSIS — S91102A Unspecified open wound of left great toe without damage to nail, initial encounter: Secondary | ICD-10-CM | POA: Diagnosis not present

## 2020-07-24 DIAGNOSIS — M79675 Pain in left toe(s): Secondary | ICD-10-CM | POA: Diagnosis not present

## 2020-08-01 DIAGNOSIS — L02415 Cutaneous abscess of right lower limb: Secondary | ICD-10-CM | POA: Diagnosis not present

## 2020-08-01 DIAGNOSIS — L0100 Impetigo, unspecified: Secondary | ICD-10-CM | POA: Diagnosis not present

## 2020-09-18 DIAGNOSIS — Z760 Encounter for issue of repeat prescription: Secondary | ICD-10-CM | POA: Diagnosis not present

## 2020-09-18 DIAGNOSIS — Z79891 Long term (current) use of opiate analgesic: Secondary | ICD-10-CM | POA: Diagnosis not present

## 2020-09-18 DIAGNOSIS — G4700 Insomnia, unspecified: Secondary | ICD-10-CM | POA: Diagnosis not present

## 2020-09-18 DIAGNOSIS — G8929 Other chronic pain: Secondary | ICD-10-CM | POA: Diagnosis not present

## 2020-09-18 DIAGNOSIS — M961 Postlaminectomy syndrome, not elsewhere classified: Secondary | ICD-10-CM | POA: Diagnosis not present

## 2020-11-05 DIAGNOSIS — H25813 Combined forms of age-related cataract, bilateral: Secondary | ICD-10-CM | POA: Diagnosis not present

## 2020-11-16 DIAGNOSIS — F411 Generalized anxiety disorder: Secondary | ICD-10-CM | POA: Diagnosis not present

## 2020-11-16 DIAGNOSIS — G8929 Other chronic pain: Secondary | ICD-10-CM | POA: Diagnosis not present

## 2020-11-16 DIAGNOSIS — I10 Essential (primary) hypertension: Secondary | ICD-10-CM | POA: Diagnosis not present

## 2020-11-16 DIAGNOSIS — Z6820 Body mass index (BMI) 20.0-20.9, adult: Secondary | ICD-10-CM | POA: Diagnosis not present

## 2020-11-16 DIAGNOSIS — E785 Hyperlipidemia, unspecified: Secondary | ICD-10-CM | POA: Diagnosis not present

## 2020-11-16 DIAGNOSIS — N951 Menopausal and female climacteric states: Secondary | ICD-10-CM | POA: Diagnosis not present

## 2020-11-16 DIAGNOSIS — F331 Major depressive disorder, recurrent, moderate: Secondary | ICD-10-CM | POA: Diagnosis not present

## 2020-11-16 DIAGNOSIS — E559 Vitamin D deficiency, unspecified: Secondary | ICD-10-CM | POA: Diagnosis not present

## 2020-11-16 DIAGNOSIS — Z139 Encounter for screening, unspecified: Secondary | ICD-10-CM | POA: Diagnosis not present

## 2020-11-16 DIAGNOSIS — M545 Low back pain, unspecified: Secondary | ICD-10-CM | POA: Diagnosis not present

## 2020-11-16 DIAGNOSIS — Z23 Encounter for immunization: Secondary | ICD-10-CM | POA: Diagnosis not present

## 2020-11-26 DIAGNOSIS — H25813 Combined forms of age-related cataract, bilateral: Secondary | ICD-10-CM | POA: Diagnosis not present

## 2020-12-06 DIAGNOSIS — Z1211 Encounter for screening for malignant neoplasm of colon: Secondary | ICD-10-CM | POA: Diagnosis not present

## 2020-12-06 DIAGNOSIS — R159 Full incontinence of feces: Secondary | ICD-10-CM | POA: Diagnosis not present

## 2020-12-06 DIAGNOSIS — K5900 Constipation, unspecified: Secondary | ICD-10-CM | POA: Diagnosis not present

## 2020-12-06 DIAGNOSIS — Z800 Family history of malignant neoplasm of digestive organs: Secondary | ICD-10-CM | POA: Diagnosis not present

## 2020-12-18 DIAGNOSIS — G8929 Other chronic pain: Secondary | ICD-10-CM | POA: Diagnosis not present

## 2020-12-18 DIAGNOSIS — G4700 Insomnia, unspecified: Secondary | ICD-10-CM | POA: Diagnosis not present

## 2020-12-18 DIAGNOSIS — R5383 Other fatigue: Secondary | ICD-10-CM | POA: Diagnosis not present

## 2020-12-18 DIAGNOSIS — M961 Postlaminectomy syndrome, not elsewhere classified: Secondary | ICD-10-CM | POA: Diagnosis not present

## 2020-12-18 DIAGNOSIS — Z760 Encounter for issue of repeat prescription: Secondary | ICD-10-CM | POA: Diagnosis not present

## 2020-12-18 DIAGNOSIS — Z79891 Long term (current) use of opiate analgesic: Secondary | ICD-10-CM | POA: Diagnosis not present

## 2020-12-19 DIAGNOSIS — R5383 Other fatigue: Secondary | ICD-10-CM | POA: Insufficient documentation

## 2020-12-19 DIAGNOSIS — F1190 Opioid use, unspecified, uncomplicated: Secondary | ICD-10-CM | POA: Insufficient documentation

## 2021-01-03 DIAGNOSIS — H524 Presbyopia: Secondary | ICD-10-CM | POA: Diagnosis not present

## 2021-01-08 DIAGNOSIS — R159 Full incontinence of feces: Secondary | ICD-10-CM | POA: Diagnosis not present

## 2021-01-08 DIAGNOSIS — Z800 Family history of malignant neoplasm of digestive organs: Secondary | ICD-10-CM | POA: Diagnosis not present

## 2021-02-27 DIAGNOSIS — Z981 Arthrodesis status: Secondary | ICD-10-CM | POA: Diagnosis not present

## 2021-03-06 DIAGNOSIS — M4326 Fusion of spine, lumbar region: Secondary | ICD-10-CM | POA: Diagnosis not present

## 2021-03-06 DIAGNOSIS — Z981 Arthrodesis status: Secondary | ICD-10-CM | POA: Diagnosis not present

## 2021-03-06 DIAGNOSIS — M545 Low back pain, unspecified: Secondary | ICD-10-CM | POA: Diagnosis not present

## 2021-03-07 DIAGNOSIS — I10 Essential (primary) hypertension: Secondary | ICD-10-CM | POA: Diagnosis not present

## 2021-03-07 DIAGNOSIS — S32059A Unspecified fracture of fifth lumbar vertebra, initial encounter for closed fracture: Secondary | ICD-10-CM | POA: Insufficient documentation

## 2021-03-07 DIAGNOSIS — S32058A Other fracture of fifth lumbar vertebra, initial encounter for closed fracture: Secondary | ICD-10-CM | POA: Diagnosis not present

## 2021-03-20 IMAGING — CT CT L SPINE W/O CM
3 of 4 series · 13 of 33 positions shown, 16 images · non-contrast
Comparison: None.

CLINICAL DATA: Low back pain

EXAM:
CT LUMBAR SPINE WITHOUT CONTRAST
TECHNIQUE: Multidetector CT imaging of the lumbar spine was performed without
intravenous contrast administration. Multiplanar CT image
reconstructions were also generated.

[Series 3: l-spine 2.00 br40 s3 lspine st · axial · 0.33mm/px · z∈[+1329,+1485]mm · 5 of 118 slices shown, 7 images]
[im 20/118  soft-tissue]
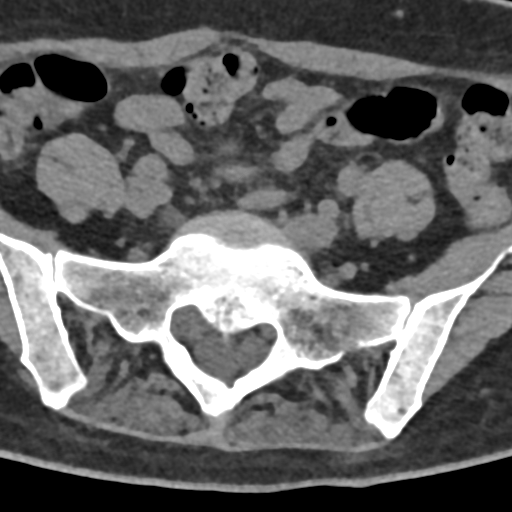
[im 20/118  bone]
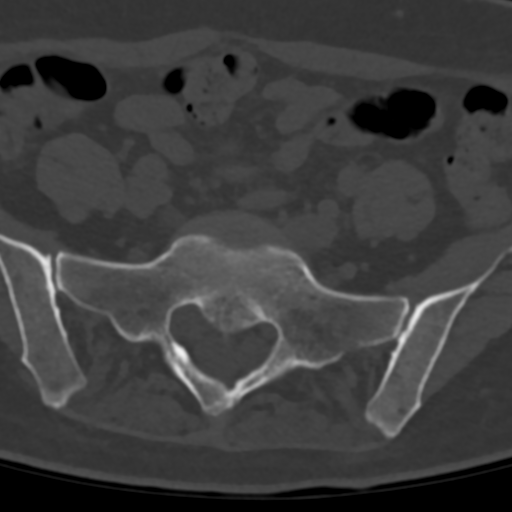
[im 40/118  bone]
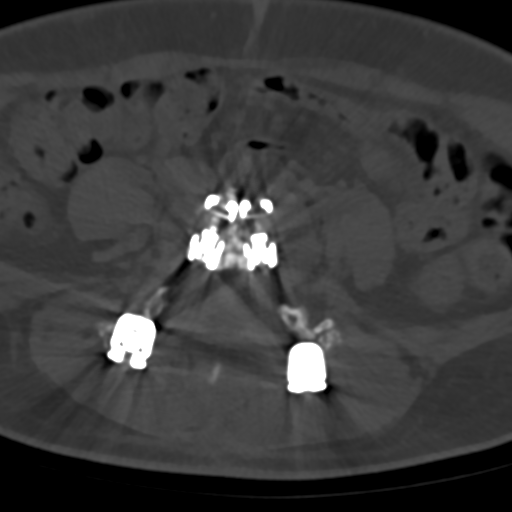
[im 59/118  bone]
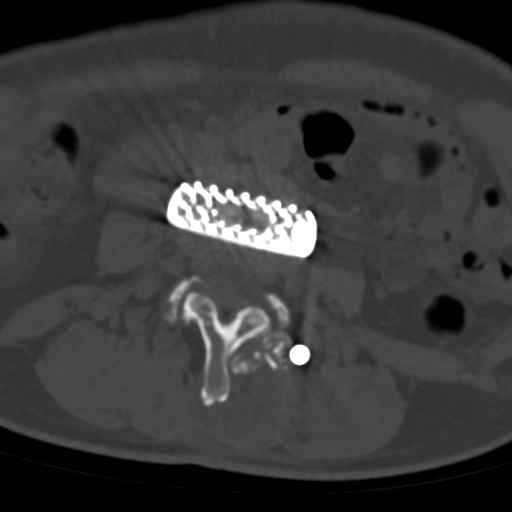
[im 79/118  bone]
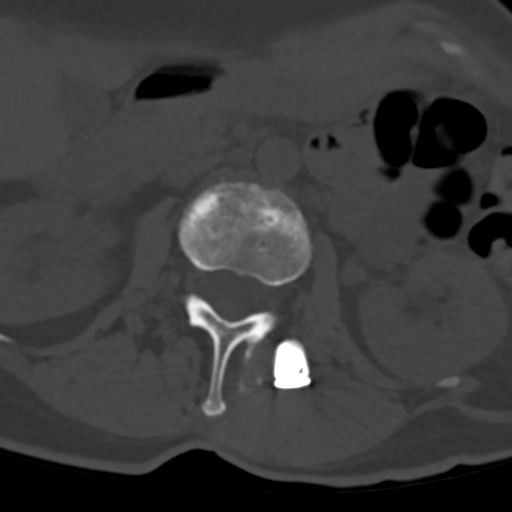
[im 98/118  soft-tissue]
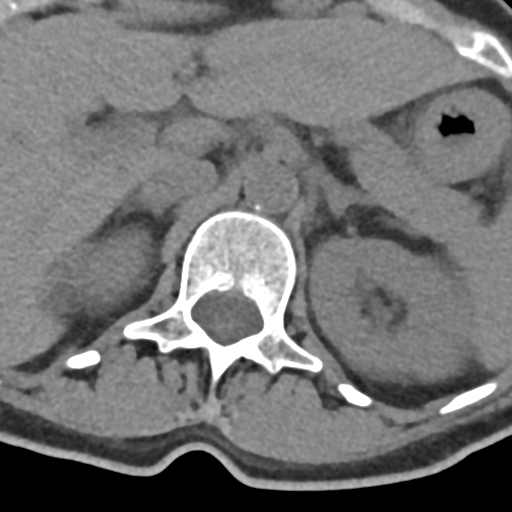
[im 98/118  bone]
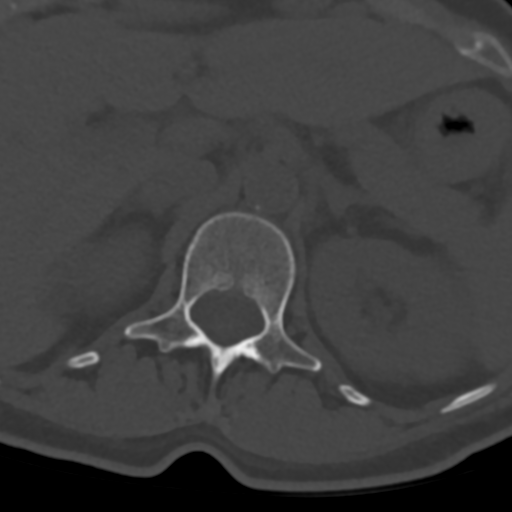

[Series 5: l-spine 2.00 br60 s3 sag bone · sagittal · 0.38mm/px · 5 of 85 slices shown, 6 images]
[im 29/85  bone]
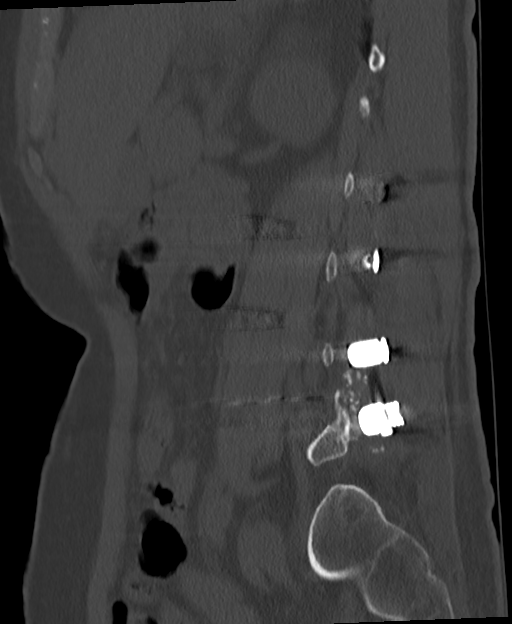
[im 36/85  bone]
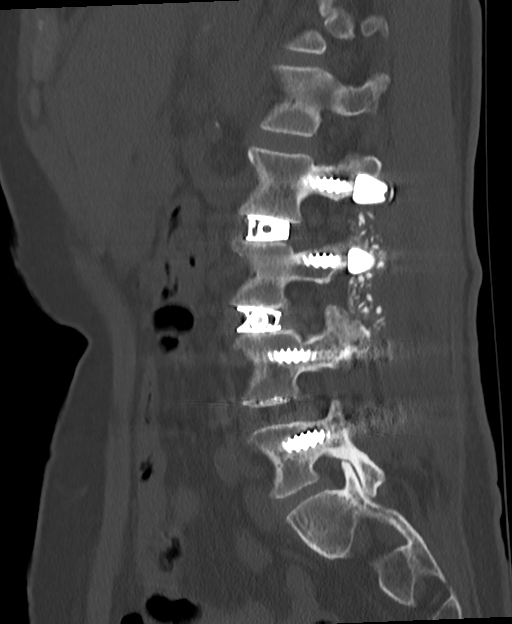
[im 43/85  soft-tissue]
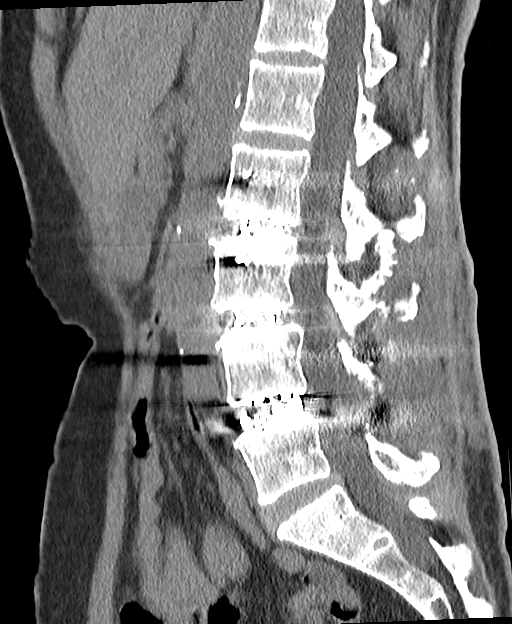
[im 43/85  bone]
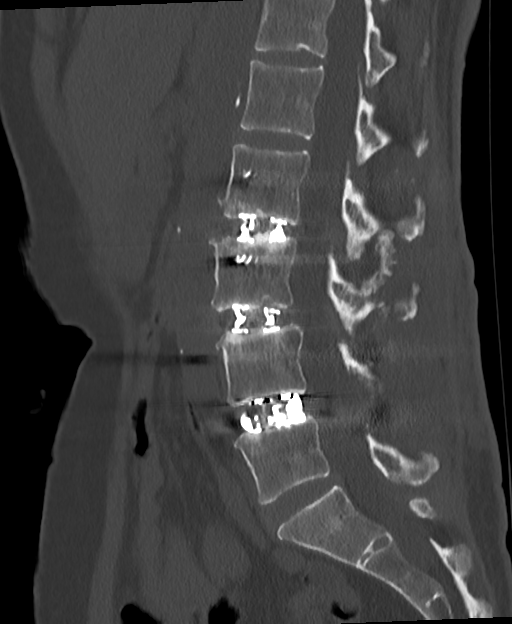
[im 50/85  bone]
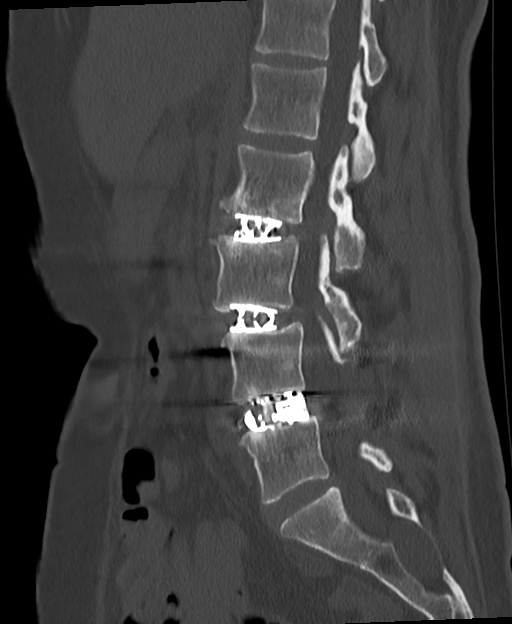
[im 57/85  bone]
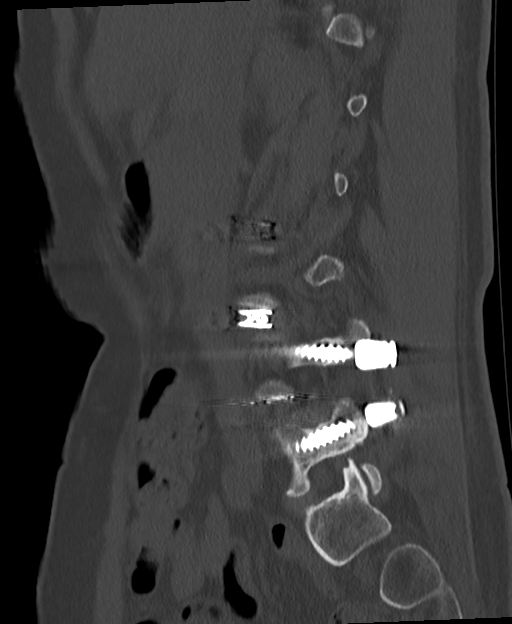

[Series 7: l-spine 2.00 br60 s3 cor bone · coronal · 0.33mm/px · 3 of 89 slices shown]
[im 18/89  bone]
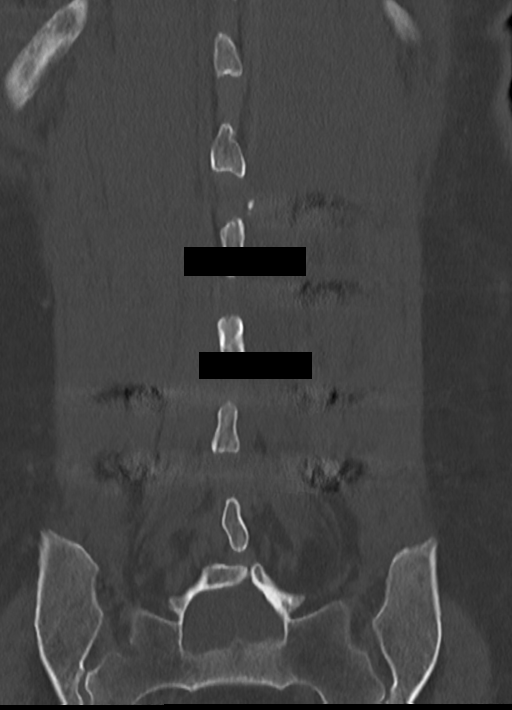
[im 36/89  bone]
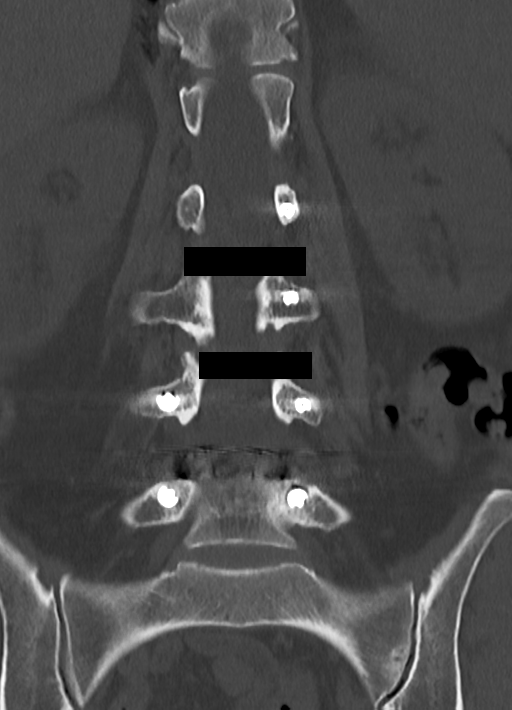
[im 53/89  bone]
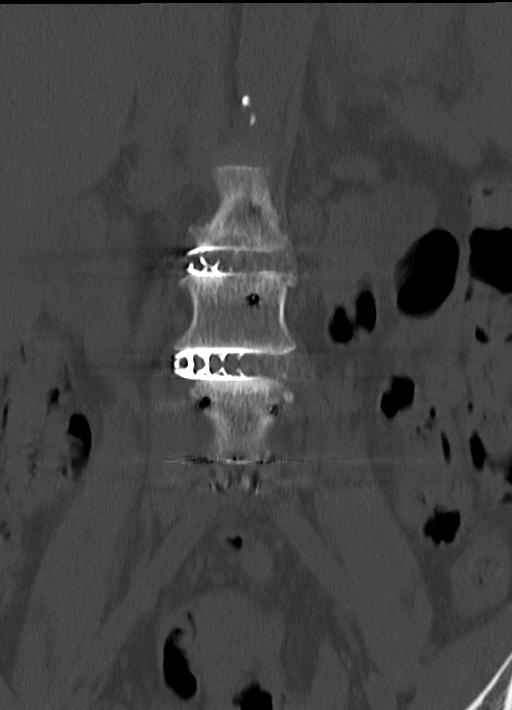

[13 of 33 positions shown; findings below may reference images not displayed]

FINDINGS: Segmentation: 5 lumbar type vertebrae.

Alignment: Minimal grade 1 anterolisthesis of L3 on L4.

Vertebrae: No acute fracture or focal pathologic process. Sacral
Tarlov cyst.

Paraspinal and other soft tissues: No acute paraspinal abnormality.
Abdominal aortic atherosclerosis. Postsurgical changes in the
posterior paraspinal soft tissues of the lumbar spine.

Disc levels:

Disc spaces: Posterior lumbar interbody fusion from L2 through L5.
Solid osseous fusion across the L2-3, L3-4 and L4-5 disc spaces.
Osteogenic material along the left posterior elements from L2
through L5 with incomplete fusion.

T12-L1: No disc protrusion, foraminal stenosis or central canal
stenosis.

L1-L2: No disc protrusion, foraminal stenosis or central canal
stenosis.

L2-L3: Interbody fusion. No foraminal stenosis. No central canal
stenosis.

L3-L4: Interbody fusion. No foraminal or central canal stenosis.
Bilateral facet arthropathy.

L4-L5: Interbody fusion and posterior decompression. No foraminal
stenosis. No definite central canal stenosis.

L5-S1: Broad-based disc bulge. Mild bilateral facet arthropathy,
left greater than right. No foraminal stenosis. No central canal
stenosis.
IMPRESSION: 1. Posterior lumbar interbody fusion from L2 through L5. Solid
osseous fusion across the L2-3, L3-4 and L4-5 disc spaces.
Osteogenic material along the left posterior elements from L2
through L5 with incomplete fusion.
2. No acute osseous injury of the lumbar spine.
3. Aortic Atherosclerosis ([id number].B).

## 2021-03-27 ENCOUNTER — Other Ambulatory Visit: Payer: Self-pay

## 2021-03-27 ENCOUNTER — Other Ambulatory Visit: Payer: Self-pay | Admitting: Neurosurgery

## 2021-03-27 DIAGNOSIS — M461 Sacroiliitis, not elsewhere classified: Secondary | ICD-10-CM | POA: Insufficient documentation

## 2021-03-28 DIAGNOSIS — F411 Generalized anxiety disorder: Secondary | ICD-10-CM | POA: Diagnosis not present

## 2021-03-28 DIAGNOSIS — F331 Major depressive disorder, recurrent, moderate: Secondary | ICD-10-CM | POA: Diagnosis not present

## 2021-03-28 DIAGNOSIS — S32058A Other fracture of fifth lumbar vertebra, initial encounter for closed fracture: Secondary | ICD-10-CM | POA: Diagnosis not present

## 2021-03-29 ENCOUNTER — Other Ambulatory Visit: Payer: Self-pay

## 2021-04-02 ENCOUNTER — Other Ambulatory Visit: Payer: Self-pay

## 2021-04-02 ENCOUNTER — Ambulatory Visit: Payer: PPO | Admitting: Vascular Surgery

## 2021-04-02 ENCOUNTER — Encounter: Payer: Self-pay | Admitting: Vascular Surgery

## 2021-04-02 VITALS — BP 105/66 | HR 75 | Temp 98.2°F | Resp 20 | Ht 62.0 in | Wt 115.0 lb

## 2021-04-02 DIAGNOSIS — Z981 Arthrodesis status: Secondary | ICD-10-CM

## 2021-04-02 NOTE — Progress Notes (Signed)
Patient name: Jean Delacruz MRN: [id number] DOB: 1950-12-07 Sex: female  REASON FOR CONSULT: Evaluate for abdominal exposure for L5-S1 ALIF  HPI: Jean Delacruz is a 71 y.o. female, with history of hypertension and chronic back pain that presents for evaluation of abdominal exposure for L5-S1 ALIF.  Patient describes chronic lower back pain since last January when she had her previous spine surgery.  This has not improved with conservative management.  In review of the notes with Dr. Trenton Gammon, she has had a previous L2-L5 fusion and now has bilateral fracturing of her L5 posterior elements with persistent back pain.  Vascular surgery has been asked to assist with abdominal exposure.  Previous abdominal surgery includes two C-sections as well as hysterectomy.  Past Medical History:  Diagnosis Date   Anxiety disorder 11/09/2013   Arthritis    Chronic pain    Depression 05/19/2014   Headache    migraines   Hypertension    Hypothyroidism    PONV (postoperative nausea and vomiting)     Past Surgical History:  Procedure Laterality Date   ABDOMINAL HYSTERECTOMY     ANTERIOR LATERAL LUMBAR FUSION WITH PERCUTANEOUS SCREW 2 LEVEL Left 02/21/2019   Procedure: Anterior Lateral Fusion Lumbar Two-Three/Lumbar Three-Four with unilateral percutaneous pedicle screws;  Surgeon: Earnie Larsson, MD;  Location: [facility];  Service: Neurosurgery;  Laterality: Left;  Anterior Lateral Fusion Lumbar Two-Three/Lumbar Three-Four with percutaneous pedicle screws   BLADDER SURGERY  2007   CESAREAN SECTION     x2   CYST EXCISION     scalp   galion cyst left hand x2     NASAL SINUS SURGERY     TONSILLECTOMY     TUBAL LIGATION      No family history on file.  SOCIAL HISTORY: Social History   Socioeconomic History   Marital status: Married    Spouse name: Not on file   Number of children: Not on file   Years of education: Not on file   Highest education level: Not on file  Occupational History   Not on  file  Tobacco Use   Smoking status: Never    Passive exposure: Never   Smokeless tobacco: Never  Vaping Use   Vaping Use: Never used  Substance and Sexual Activity   Alcohol use: No   Drug use: No   Sexual activity: Not on file  Other Topics Concern   Not on file  Social History Narrative   Not on file   Social Determinants of Health   Financial Resource Strain: Not on file  Food Insecurity: Not on file  Transportation Needs: Not on file  Physical Activity: Not on file  Stress: Not on file  Social Connections: Not on file  Intimate Partner Violence: Not on file    No Known Allergies  Current Outpatient Medications  Medication Sig Dispense Refill   busPIRone (BUSPAR) 5 MG tablet Take 5 mg by mouth 3 (three) times daily.     diazepam (VALIUM) 5 MG tablet Take 1-2 tablets (5-10 mg total) by mouth every 6 (six) hours as needed for muscle spasms. 35 tablet 0   DULoxetine (CYMBALTA) 60 MG capsule Take 60 mg by mouth daily.     estradiol (ESTRACE) 0.5 MG tablet Take 0.5 mg by mouth daily.      gabapentin (NEURONTIN) 300 MG capsule      HYDROcodone-acetaminophen (NORCO) 7.5-325 MG tablet      levothyroxine (SYNTHROID, LEVOTHROID) 25 MCG tablet Take 25 mcg  by mouth daily before breakfast.      lisinopril-hydrochlorothiazide (ZESTORETIC) 20-25 MG tablet Take 1 tablet by mouth daily.      oxyCODONE 10 MG TABS Take 1 tablet (10 mg total) by mouth every 3 (three) hours as needed for severe pain ((score 7 to 10)). 30 tablet 0   pravastatin (PRAVACHOL) 20 MG tablet Take 20 mg by mouth at bedtime.   4   traZODone (DESYREL) 150 MG tablet Take 150 mg by mouth at bedtime.     No current facility-administered medications for this visit.    REVIEW OF SYSTEMS:  [X]  denotes positive finding, [ ]  denotes negative finding Cardiac  Comments:  Chest pain or chest pressure:    Shortness of breath upon exertion:    Short of breath when lying flat:    Irregular heart rhythm:        Vascular     Pain in calf, thigh, or hip brought on by ambulation:    Pain in feet at night that wakes you up from your sleep:     Blood clot in your veins:    Leg swelling:         Pulmonary    Oxygen at home:    Productive cough:     Wheezing:         Neurologic    Sudden weakness in arms or legs:     Sudden numbness in arms or legs:     Sudden onset of difficulty speaking or slurred speech:    Temporary loss of vision in one eye:     Problems with dizziness:         Gastrointestinal    Blood in stool:     Vomited blood:         Genitourinary    Burning when urinating:     Blood in urine:        Psychiatric    Major depression:         Hematologic    Bleeding problems:    Problems with blood clotting too easily:        Skin    Rashes or ulcers:        Constitutional    Fever or chills:      PHYSICAL EXAM: Vitals:   04/02/21 1327  BP: 105/66  Pulse: 75  Resp: 20  Temp: 98.2 F (36.8 C)  TempSrc: Temporal  SpO2: 97%  Weight: 115 lb (52.2 kg)  Height: 5\' 2"  (1.575 m)    GENERAL: The patient is a well-nourished female, in no acute distress. The vital signs are documented above. CARDIAC: There is a regular rate and rhythm.  VASCULAR:  Palpable femoral pulses bilaterally Palpable DP pulses bilateral PULMONARY: No respiratory distress. ABDOMEN: Soft and non-tender.  Lower midline incision from previous C-section. MUSCULOSKELETAL: There are no major deformities or cyanosis. NEUROLOGIC: No focal weakness or paresthesias are detected. SKIN: There are no ulcers or rashes noted. PSYCHIATRIC: The patient has a normal affect.  DATA:   CT Lumbar Spine reviewed from 02/14/20:     Assessment/Plan:  71 year old female with chronic lower back pain status post L2-L5 fusion with new bilateral fracturing of her L5 posterior elements with persistent back pain that vascular surgery has been asked to assist with L5-S1 abdominal exposure for L5-S1 ALIF.  I have reviewed the  patient's CT scan as well as examined her abdomen and I think she would be a good candidate for anterior approach.  I discussed a  transverse incision over the left rectus muscle with mobilization of the left rectus and entering the retroperitoneum to mobilize peritoneum and left ureter to the midline.  Discussed mobilizing iliac artery and vein to expose the disc space from the front.  Discussed risk of injury to the above structures.  Look forward to assisting Dr. Trenton Gammon and she is scheduled for next week on 04/12/2021.  All questions answered.   [name], MD [facility] Office: [phone]

## 2021-04-08 NOTE — Pre-Procedure Instructions (Signed)
Prabhjot Maddux  04/08/2021       Surgical Instructions   Your procedure is scheduled on Fri., Feb. 24, 2023 from 7:30AM-11:45AM.  Report to [facility] Main Entrance "A" at 5:30 A.M., then check in with the Admitting office.  Call this number if you have problems the morning of surgery:  [phone]   Remember:  Do not eat or drink after midnight on Feb. 23rd   Take these medicines the morning of surgery with A SIP OF WATER: Estradiol (ESTRACE) Gabapentin (NEURONTIN) Levothyroxine (SYNTHROID, LEVOTHROID)  If Needed: HYDROcodone-acetaminophen ([place])  As of today, STOP taking any Aspirin (unless otherwise instructed by your surgeon) Aleve, Naproxen, Ibuprofen, Motrin, Advil, Goody's, BC's, all herbal medications, fish oil, and all vitamins.             Day of Surgery: Do not wear jewelry or makeup Do not wear lotions, powders, perfumes/colognes, or deodorant. Do not shave 48 hours prior to surgery.  Men may shave face and neck. Do not bring valuables to the hospital. DO Not wear nail polish, gel polish, artificial nails, or any other type of covering on natural nails (fingers and toes) If you have artificial nails or gel coating that need to be removed by a nail salon, please have this removed prior to surgery. Artificial nails or gel coating may interfere with anesthesia's ability to adequately monitor your vital signs.             [facility] is not responsible for any belongings or valuables.  Do NOT Smoke (Tobacco/Vaping)  24 hours prior to your procedure  If you use a CPAP at night, you may bring your mask and machine for your overnight stay.   Contacts, glasses, hearing aids, dentures or partials may not be worn into surgery, please bring cases for these belongings   For patients admitted to the hospital, discharge time will be determined by your treatment team.   Patients discharged the day of surgery will not be allowed to drive home, and someone needs to stay  with them for 24 hours.  NO VISITORS WILL BE ALLOWED IN PRE-OP WHERE PATIENTS ARE PREPPED FOR SURGERY.  ONLY 1 SUPPORT PERSON MAY BE PRESENT IN THE WAITING ROOM WHILE YOU ARE IN SURGERY.  IF YOU ARE TO BE ADMITTED, ONCE YOU ARE IN YOUR ROOM YOU WILL BE ALLOWED TWO (2) VISITORS. 1 (ONE) VISITOR MAY STAY OVERNIGHT BUT MUST ARRIVE TO THE ROOM BY 8pm.  Minor children may have two parents present. Special consideration for safety and communication needs will be reviewed on a case by case basis.  Special instructions:    Oral Hygiene is also important to reduce your risk of infection.  Remember - BRUSH YOUR TEETH THE MORNING OF SURGERY WITH YOUR REGULAR TOOTHPASTE  [place]- Preparing For Surgery  Before surgery, you can play an important role. Because skin is not sterile, your skin needs to be as free of germs as possible. You can reduce the number of germs on your skin by washing with CHG (chlorahexidine gluconate) Soap before surgery.  CHG is an antiseptic cleaner which kills germs and bonds with the skin to continue killing germs even after washing.    Please do not use if you have an allergy to CHG or antibacterial soaps. If your skin becomes reddened/irritated stop using the CHG.  Do not shave (including legs and underarms) for at least 48 hours prior to first CHG shower. It is OK to shave your face.  Please follow these instructions carefully.    Shower the NIGHT BEFORE SURGERY and the MORNING OF SURGERY with CHG Soap.   If you chose to wash your hair, wash your hair first as usual with your normal shampoo. After you shampoo, rinse your hair and body thoroughly to remove the shampoo.  Then [employer] and genitals (private parts) with your normal soap and rinse thoroughly to remove soap.  After that Use CHG Soap as you would any other liquid soap. You can apply CHG directly to the skin and wash gently with a scrungie or a clean washcloth.   Apply the CHG Soap to your body ONLY FROM THE NECK  DOWN.  Do not use on open wounds or open sores. Avoid contact with your eyes, ears, mouth and genitals (private parts). Wash Face and genitals (private parts)  with your normal soap.   Wash thoroughly, paying special attention to the area where your surgery will be performed.  Thoroughly rinse your body with warm water from the neck down.  DO NOT shower/wash with your normal soap after using and rinsing off the CHG Soap.  Pat yourself dry with a CLEAN TOWEL.  Wear CLEAN PAJAMAS to bed the night before surgery  Place CLEAN SHEETS on your bed the night before your surgery  DO NOT SLEEP WITH PETS.  Reminder: Take a shower with CHG soap. Wear Clean/Comfortable clothing the morning of surgery Do not apply any deodorants/lotions.   Remember to brush your teeth WITH YOUR REGULAR TOOTHPASTE.  After your COVID test   You are not required to quarantine however you are required to wear a well-fitting mask when you are out and around people not in your household.  If your mask becomes wet or soiled, replace with a new one.  Wash your hands often with soap and water for 20 seconds or clean your hands with an alcohol-based hand sanitizer that contains at least 60% alcohol.  Do not share personal items.  Notify your provider: if you are in close contact with someone who has COVID  or if you develop a fever of 100.4 or greater, sneezing, cough, sore throat, shortness of breath or body aches.  Please read over the following fact sheets that you were given.

## 2021-04-09 ENCOUNTER — Encounter (HOSPITAL_COMMUNITY): Payer: Self-pay

## 2021-04-09 ENCOUNTER — Encounter (HOSPITAL_COMMUNITY)
Admission: RE | Admit: 2021-04-09 | Discharge: 2021-04-09 | Disposition: A | Discharge status: Home / Self Care | Payer: PPO | Source: Ambulatory Visit | Attending: Neurosurgery | Admitting: Neurosurgery

## 2021-04-09 ENCOUNTER — Other Ambulatory Visit: Payer: Self-pay

## 2021-04-09 VITALS — BP 116/57 | HR 67 | Temp 98.2°F | Resp 18 | Ht 62.0 in | Wt 115.2 lb

## 2021-04-09 DIAGNOSIS — I10 Essential (primary) hypertension: Secondary | ICD-10-CM | POA: Diagnosis present

## 2021-04-09 DIAGNOSIS — T189XXA Foreign body of alimentary tract, part unspecified, initial encounter: Secondary | ICD-10-CM | POA: Diagnosis not present

## 2021-04-09 DIAGNOSIS — G8929 Other chronic pain: Secondary | ICD-10-CM | POA: Diagnosis present

## 2021-04-09 DIAGNOSIS — S32058A Other fracture of fifth lumbar vertebra, initial encounter for closed fracture: Secondary | ICD-10-CM | POA: Diagnosis not present

## 2021-04-09 DIAGNOSIS — S32059A Unspecified fracture of fifth lumbar vertebra, initial encounter for closed fracture: Secondary | ICD-10-CM | POA: Diagnosis present

## 2021-04-09 DIAGNOSIS — M4836 Traumatic spondylopathy, lumbar region: Secondary | ICD-10-CM | POA: Diagnosis not present

## 2021-04-09 DIAGNOSIS — G43909 Migraine, unspecified, not intractable, without status migrainosus: Secondary | ICD-10-CM | POA: Diagnosis present

## 2021-04-09 DIAGNOSIS — F418 Other specified anxiety disorders: Secondary | ICD-10-CM | POA: Diagnosis not present

## 2021-04-09 DIAGNOSIS — Z79899 Other long term (current) drug therapy: Secondary | ICD-10-CM | POA: Diagnosis not present

## 2021-04-09 DIAGNOSIS — E039 Hypothyroidism, unspecified: Secondary | ICD-10-CM | POA: Diagnosis present

## 2021-04-09 DIAGNOSIS — T84216A Breakdown (mechanical) of internal fixation device of vertebrae, initial encounter: Secondary | ICD-10-CM | POA: Diagnosis not present

## 2021-04-09 DIAGNOSIS — Z20822 Contact with and (suspected) exposure to covid-19: Secondary | ICD-10-CM | POA: Insufficient documentation

## 2021-04-09 DIAGNOSIS — M545 Low back pain, unspecified: Secondary | ICD-10-CM | POA: Diagnosis present

## 2021-04-09 DIAGNOSIS — Z981 Arthrodesis status: Secondary | ICD-10-CM | POA: Diagnosis not present

## 2021-04-09 DIAGNOSIS — M1990 Unspecified osteoarthritis, unspecified site: Secondary | ICD-10-CM | POA: Diagnosis present

## 2021-04-09 DIAGNOSIS — M4326 Fusion of spine, lumbar region: Secondary | ICD-10-CM | POA: Diagnosis not present

## 2021-04-09 DIAGNOSIS — Z79890 Hormone replacement therapy (postmenopausal): Secondary | ICD-10-CM | POA: Diagnosis not present

## 2021-04-09 DIAGNOSIS — S32009A Unspecified fracture of unspecified lumbar vertebra, initial encounter for closed fracture: Secondary | ICD-10-CM | POA: Diagnosis not present

## 2021-04-09 DIAGNOSIS — Z01818 Encounter for other preprocedural examination: Secondary | ICD-10-CM

## 2021-04-09 LAB — CBC
HCT: 36.6 % (ref 36.0–46.0)
Hemoglobin: 12.2 g/dL (ref 12.0–15.0)
MCH: 31 pg (ref 26.0–34.0)
MCHC: 33.3 g/dL (ref 30.0–36.0)
MCV: 92.9 fL (ref 80.0–100.0)
Platelets: 171 10*3/uL (ref 150–400)
RBC: 3.94 MIL/uL (ref 3.87–5.11)
RDW: 11.5 % (ref 11.5–15.5)
WBC: 6.3 10*3/uL (ref 4.0–10.5)
nRBC: 0 % (ref 0.0–0.2)

## 2021-04-09 LAB — BASIC METABOLIC PANEL
Anion gap: 7 (ref 5–15)
BUN: 23 mg/dL (ref 8–23)
CO2: 30 mmol/L (ref 22–32)
Calcium: 9.2 mg/dL (ref 8.9–10.3)
Chloride: 98 mmol/L (ref 98–111)
Creatinine, Ser: 1.4 mg/dL — ABNORMAL HIGH (ref 0.44–1.00)
GFR, Estimated: 40 mL/min — ABNORMAL LOW (ref >60)
Glucose, Bld: 97 mg/dL (ref 70–99)
Potassium: 3.4 mmol/L — ABNORMAL LOW (ref 3.5–5.1)
Sodium: 135 mmol/L (ref 135–145)

## 2021-04-09 LAB — SURGICAL PCR SCREEN
MRSA, PCR: POSITIVE — AB
Staphylococcus aureus: POSITIVE — AB

## 2021-04-09 LAB — SARS CORONAVIRUS 2 (TAT 6-24 HRS): SARS Coronavirus 2: NEGATIVE

## 2021-04-09 NOTE — Progress Notes (Signed)
PCP - Laverna Peace, NP Cardiologist - denies  PPM/ICD - denies Device Orders - n/a Rep Notified - n/a  Chest x-ray - n/a EKG - 04/09/2021 Stress Test - denies ECHO - denies Cardiac Cath - denies  Sleep Study - denies CPAP - n/a  Fasting Blood Sugar - n/a  Blood Thinner Instructions: n/a  Aspirin Instructions: Patient was instructed: As of today, STOP taking any Aspirin (unless otherwise instructed by your surgeon) Aleve, Naproxen, Ibuprofen, Motrin, Advil, Goody's, BC's, all herbal medications, fish oil, and all vitamins.   ERAS Protcol - n/a   COVID TEST- done in PAT on 04/09/2021   Anesthesia review: no  Patient denies shortness of breath, fever, cough and chest pain at PAT appointment   All instructions explained to the patient, with a verbal understanding of the material. Patient agrees to go over the instructions while at home for a better understanding. Patient also instructed to self quarantine after being tested for COVID-19. The opportunity to ask questions was provided.

## 2021-04-11 NOTE — Anesthesia Preprocedure Evaluation (Addendum)
Anesthesia Evaluation  Patient identified by MRN, date of birth, ID band Patient awake    Reviewed: Allergy & Precautions, NPO status , Patient's Chart, lab work & pertinent test results, reviewed documented beta blocker date and time   History of Anesthesia Complications (+) PONV and history of anesthetic complications  Airway Mallampati: I  TM Distance: >3 FB Neck ROM: Full    Dental no notable dental hx. (+) Caps, Teeth Intact, Dental Advisory Given   Pulmonary neg pulmonary ROS,    Pulmonary exam normal breath sounds clear to auscultation       Cardiovascular hypertension, Pt. on medications negative cardio ROS Normal cardiovascular exam Rhythm:Regular Rate:Normal     Neuro/Psych  Headaches, PSYCHIATRIC DISORDERS Anxiety Depression Bipolar Disorder negative neurological ROS  negative psych ROS   GI/Hepatic negative GI ROS, Neg liver ROS,   Endo/Other  negative endocrine ROSHypothyroidism   Renal/GU negative Renal ROS  negative genitourinary   Musculoskeletal negative musculoskeletal ROS (+) Arthritis , Osteoarthritis,  Synovial cyst L4-5 Low back pain   Abdominal   Peds negative pediatric ROS (+)  Hematology negative hematology ROS (+)   Anesthesia Other Findings   Reproductive/Obstetrics negative OB ROS                            Anesthesia Physical  Anesthesia Plan  ASA: 3  Anesthesia Plan: General   Post-op Pain Management: Tylenol PO (pre-op)*, Ketamine IV*, Gabapentin PO (pre-op)* and Celebrex PO (pre-op)*   Induction: Intravenous  PONV Risk Score and Plan: 4 or greater and Midazolam, Ondansetron, Dexamethasone and Treatment may vary due to age or medical condition  Airway Management Planned: Oral ETT  Additional Equipment: None  Intra-op Plan:   Post-operative Plan: Extubation in OR  Informed Consent: I have reviewed the patients History and Physical, chart,  labs and discussed the procedure including the risks, benefits and alternatives for the proposed anesthesia with the patient or authorized representative who has indicated his/her understanding and acceptance.     Dental advisory given  Plan Discussed with: CRNA, Surgeon and Anesthesiologist  Anesthesia Plan Comments: (2 large bore IV's)      Anesthesia Quick Evaluation

## 2021-04-12 ENCOUNTER — Inpatient Hospital Stay (HOSPITAL_COMMUNITY)
Admission: RE | Admit: 2021-04-12 | Discharge: 2021-04-13 | DRG: 455 | Disposition: A | Discharge status: Home / Self Care | Payer: PPO | Attending: Neurosurgery | Admitting: Neurosurgery

## 2021-04-12 ENCOUNTER — Inpatient Hospital Stay (HOSPITAL_COMMUNITY): Payer: PPO

## 2021-04-12 ENCOUNTER — Inpatient Hospital Stay (HOSPITAL_COMMUNITY): Payer: PPO | Admitting: Certified Registered Nurse Anesthetist

## 2021-04-12 ENCOUNTER — Encounter (HOSPITAL_COMMUNITY): Payer: Self-pay | Admitting: Neurosurgery

## 2021-04-12 ENCOUNTER — Encounter (HOSPITAL_COMMUNITY)
Admission: RE | Disposition: A | Discharge status: Home / Self Care | Payer: Self-pay | Source: Home / Self Care | Attending: Neurosurgery

## 2021-04-12 ENCOUNTER — Other Ambulatory Visit: Payer: Self-pay

## 2021-04-12 DIAGNOSIS — Z981 Arthrodesis status: Secondary | ICD-10-CM | POA: Diagnosis not present

## 2021-04-12 DIAGNOSIS — T84216A Breakdown (mechanical) of internal fixation device of vertebrae, initial encounter: Secondary | ICD-10-CM | POA: Diagnosis not present

## 2021-04-12 DIAGNOSIS — I10 Essential (primary) hypertension: Secondary | ICD-10-CM

## 2021-04-12 DIAGNOSIS — G43909 Migraine, unspecified, not intractable, without status migrainosus: Secondary | ICD-10-CM | POA: Diagnosis present

## 2021-04-12 DIAGNOSIS — F418 Other specified anxiety disorders: Secondary | ICD-10-CM

## 2021-04-12 DIAGNOSIS — M4836 Traumatic spondylopathy, lumbar region: Secondary | ICD-10-CM

## 2021-04-12 DIAGNOSIS — M1990 Unspecified osteoarthritis, unspecified site: Secondary | ICD-10-CM | POA: Diagnosis not present

## 2021-04-12 DIAGNOSIS — Z79899 Other long term (current) drug therapy: Secondary | ICD-10-CM | POA: Diagnosis not present

## 2021-04-12 DIAGNOSIS — S32059A Unspecified fracture of fifth lumbar vertebra, initial encounter for closed fracture: Secondary | ICD-10-CM | POA: Diagnosis not present

## 2021-04-12 DIAGNOSIS — T189XXA Foreign body of alimentary tract, part unspecified, initial encounter: Secondary | ICD-10-CM | POA: Diagnosis not present

## 2021-04-12 DIAGNOSIS — G8929 Other chronic pain: Secondary | ICD-10-CM | POA: Diagnosis not present

## 2021-04-12 DIAGNOSIS — Z79890 Hormone replacement therapy (postmenopausal): Secondary | ICD-10-CM | POA: Diagnosis not present

## 2021-04-12 DIAGNOSIS — Z20822 Contact with and (suspected) exposure to covid-19: Secondary | ICD-10-CM | POA: Diagnosis not present

## 2021-04-12 DIAGNOSIS — E039 Hypothyroidism, unspecified: Secondary | ICD-10-CM | POA: Diagnosis not present

## 2021-04-12 DIAGNOSIS — S32058A Other fracture of fifth lumbar vertebra, initial encounter for closed fracture: Secondary | ICD-10-CM | POA: Diagnosis not present

## 2021-04-12 DIAGNOSIS — M545 Low back pain, unspecified: Secondary | ICD-10-CM | POA: Diagnosis present

## 2021-04-12 DIAGNOSIS — Z419 Encounter for procedure for purposes other than remedying health state, unspecified: Secondary | ICD-10-CM

## 2021-04-12 DIAGNOSIS — M4326 Fusion of spine, lumbar region: Secondary | ICD-10-CM | POA: Diagnosis not present

## 2021-04-12 HISTORY — PX: LAMINECTOMY WITH POSTERIOR LATERAL ARTHRODESIS LEVEL 2: SHX6336

## 2021-04-12 HISTORY — PX: ABDOMINAL EXPOSURE: SHX5708

## 2021-04-12 HISTORY — PX: ANTERIOR LUMBAR FUSION: SHX1170

## 2021-04-12 LAB — PREPARE RBC (CROSSMATCH)

## 2021-04-12 SURGERY — ANTERIOR LUMBAR FUSION 1 LEVEL
Anesthesia: General | Site: Spine Lumbar

## 2021-04-12 MED ORDER — TRAZODONE HCL 150 MG PO TABS
150.0000 mg | ORAL_TABLET | Freq: Every day | ORAL | Status: DC
  Administered 2021-04-12: 150 mg via ORAL
  Filled 2021-04-12: qty 1

## 2021-04-12 MED ORDER — ACETAMINOPHEN 325 MG PO TABS
650.0000 mg | ORAL_TABLET | ORAL | Status: DC | PRN
  Administered 2021-04-12 – 2021-04-13 (×3): 650 mg via ORAL
  Filled 2021-04-12 (×3): qty 2

## 2021-04-12 MED ORDER — ACETAMINOPHEN 500 MG PO TABS: 1000.0000 mg | ORAL_TABLET | ORAL | Status: AC

## 2021-04-12 MED ORDER — LACTATED RINGERS IV SOLN: INTRAVENOUS | Status: DC | PRN

## 2021-04-12 MED ORDER — ORAL CARE MOUTH RINSE: 15.0000 mL | Freq: Once | OROMUCOSAL | Status: AC

## 2021-04-12 MED ORDER — ONDANSETRON HCL 4 MG/2ML IJ SOLN: 4.0000 mg | Freq: Once | INTRAMUSCULAR | Status: DC | PRN

## 2021-04-12 MED ORDER — PROPOFOL 10 MG/ML IV BOLUS
INTRAVENOUS | Status: AC
  Filled 2021-04-12: qty 20

## 2021-04-12 MED ORDER — HYDROCHLOROTHIAZIDE 25 MG PO TABS
25.0000 mg | ORAL_TABLET | Freq: Every day | ORAL | Status: DC
  Administered 2021-04-12: 25 mg via ORAL
  Filled 2021-04-12: qty 1

## 2021-04-12 MED ORDER — THROMBIN 20000 UNITS EX SOLR
CUTANEOUS | Status: AC
  Filled 2021-04-12: qty 20000

## 2021-04-12 MED ORDER — OXYCODONE HCL 5 MG/5ML PO SOLN: 5.0000 mg | Freq: Once | ORAL | Status: AC | PRN

## 2021-04-12 MED ORDER — BUPIVACAINE HCL (PF) 0.25 % IJ SOLN
INTRAMUSCULAR | Status: AC
  Filled 2021-04-12: qty 30

## 2021-04-12 MED ORDER — CELECOXIB 200 MG PO CAPS: 200.0000 mg | ORAL_CAPSULE | Freq: Once | ORAL | Status: AC

## 2021-04-12 MED ORDER — ONDANSETRON HCL 4 MG/2ML IJ SOLN: 4.0000 mg | Freq: Four times a day (QID) | INTRAMUSCULAR | Status: DC | PRN

## 2021-04-12 MED ORDER — PHENYLEPHRINE 40 MCG/ML (10ML) SYRINGE FOR IV PUSH (FOR BLOOD PRESSURE SUPPORT)
PREFILLED_SYRINGE | INTRAVENOUS | Status: AC
  Filled 2021-04-12: qty 10

## 2021-04-12 MED ORDER — ALBUMIN HUMAN 5 % IV SOLN: INTRAVENOUS | Status: DC | PRN

## 2021-04-12 MED ORDER — LACTATED RINGERS IV SOLN: INTRAVENOUS | Status: DC

## 2021-04-12 MED ORDER — PHENOL 1.4 % MT LIQD: 1.0000 | OROMUCOSAL | Status: DC | PRN

## 2021-04-12 MED ORDER — POLYETHYLENE GLYCOL 3350 17 G PO PACK
17.0000 g | PACK | Freq: Every day | ORAL | Status: DC | PRN
  Administered 2021-04-12: 17 g via ORAL
  Filled 2021-04-12: qty 1

## 2021-04-12 MED ORDER — ACETAMINOPHEN 325 MG PO TABS: 325.0000 mg | ORAL_TABLET | ORAL | Status: DC | PRN

## 2021-04-12 MED ORDER — KETAMINE HCL 50 MG/5ML IJ SOSY
PREFILLED_SYRINGE | INTRAMUSCULAR | Status: AC
  Filled 2021-04-12: qty 5

## 2021-04-12 MED ORDER — VANCOMYCIN HCL IN DEXTROSE 1-5 GM/200ML-% IV SOLN
1000.0000 mg | Freq: Once | INTRAVENOUS | Status: AC
  Administered 2021-04-12: 1000 mg via INTRAVENOUS
  Filled 2021-04-12: qty 200

## 2021-04-12 MED ORDER — PROPOFOL 10 MG/ML IV BOLUS
INTRAVENOUS | Status: DC | PRN
Start: 2021-04-12 — End: 2021-04-12
  Administered 2021-04-12: 120 mg via INTRAVENOUS

## 2021-04-12 MED ORDER — GABAPENTIN 300 MG PO CAPS
600.0000 mg | ORAL_CAPSULE | Freq: Three times a day (TID) | ORAL | Status: DC
  Administered 2021-04-12 (×2): 600 mg via ORAL
  Filled 2021-04-12 (×2): qty 2

## 2021-04-12 MED ORDER — BISACODYL 10 MG RE SUPP: 10.0000 mg | Freq: Every day | RECTAL | Status: DC | PRN

## 2021-04-12 MED ORDER — OXYCODONE HCL 5 MG PO TABS
10.0000 mg | ORAL_TABLET | ORAL | Status: DC | PRN
  Administered 2021-04-12 – 2021-04-13 (×6): 10 mg via ORAL
  Filled 2021-04-12 (×6): qty 2

## 2021-04-12 MED ORDER — ROCURONIUM BROMIDE 10 MG/ML (PF) SYRINGE
PREFILLED_SYRINGE | INTRAVENOUS | Status: DC | PRN
Start: 2021-04-12 — End: 2021-04-12
  Administered 2021-04-12: 20 mg via INTRAVENOUS
  Administered 2021-04-12: 30 mg via INTRAVENOUS
  Administered 2021-04-12: 20 mg via INTRAVENOUS
  Administered 2021-04-12: 50 mg via INTRAVENOUS

## 2021-04-12 MED ORDER — OXYCODONE HCL 5 MG PO TABS
ORAL_TABLET | ORAL | Status: AC
  Filled 2021-04-12: qty 1

## 2021-04-12 MED ORDER — ESTRADIOL 1 MG PO TABS
0.5000 mg | ORAL_TABLET | Freq: Every day | ORAL | Status: DC
  Filled 2021-04-12: qty 0.5

## 2021-04-12 MED ORDER — 0.9 % SODIUM CHLORIDE (POUR BTL) OPTIME
TOPICAL | Status: DC | PRN
  Administered 2021-04-12: 1000 mL

## 2021-04-12 MED ORDER — ROCURONIUM BROMIDE 10 MG/ML (PF) SYRINGE
PREFILLED_SYRINGE | INTRAVENOUS | Status: AC
  Filled 2021-04-12: qty 10

## 2021-04-12 MED ORDER — HYDROMORPHONE HCL 1 MG/ML IJ SOLN: 1.0000 mg | INTRAMUSCULAR | Status: DC | PRN

## 2021-04-12 MED ORDER — MEPERIDINE HCL 25 MG/ML IJ SOLN: 6.2500 mg | INTRAMUSCULAR | Status: DC | PRN

## 2021-04-12 MED ORDER — VANCOMYCIN HCL 1000 MG IV SOLR
INTRAVENOUS | Status: AC
  Filled 2021-04-12: qty 20

## 2021-04-12 MED ORDER — OXYCODONE HCL 5 MG PO TABS
5.0000 mg | ORAL_TABLET | Freq: Once | ORAL | Status: AC | PRN
  Administered 2021-04-12: 5 mg via ORAL

## 2021-04-12 MED ORDER — FENTANYL CITRATE (PF) 250 MCG/5ML IJ SOLN
INTRAMUSCULAR | Status: AC
  Filled 2021-04-12: qty 5

## 2021-04-12 MED ORDER — CHLORHEXIDINE GLUCONATE CLOTH 2 % EX PADS: 6.0000 | MEDICATED_PAD | Freq: Once | CUTANEOUS | Status: DC

## 2021-04-12 MED ORDER — MENTHOL 3 MG MT LOZG: 1.0000 | LOZENGE | OROMUCOSAL | Status: DC | PRN

## 2021-04-12 MED ORDER — ACETAMINOPHEN 500 MG PO TABS
ORAL_TABLET | ORAL | Status: AC
  Administered 2021-04-12: 1000 mg via ORAL
  Filled 2021-04-12: qty 2

## 2021-04-12 MED ORDER — PHENYLEPHRINE HCL-NACL 20-0.9 MG/250ML-% IV SOLN
INTRAVENOUS | Status: DC | PRN
  Administered 2021-04-12: 25 ug/min via INTRAVENOUS

## 2021-04-12 MED ORDER — LIDOCAINE 2% (20 MG/ML) 5 ML SYRINGE
INTRAMUSCULAR | Status: AC
  Filled 2021-04-12: qty 5

## 2021-04-12 MED ORDER — FENTANYL CITRATE (PF) 100 MCG/2ML IJ SOLN
25.0000 ug | INTRAMUSCULAR | Status: DC | PRN
  Administered 2021-04-12 (×3): 50 ug via INTRAVENOUS

## 2021-04-12 MED ORDER — BUPIVACAINE HCL (PF) 0.25 % IJ SOLN
INTRAMUSCULAR | Status: DC | PRN
  Administered 2021-04-12: 20 mL

## 2021-04-12 MED ORDER — LEVOTHYROXINE SODIUM 25 MCG PO TABS
25.0000 ug | ORAL_TABLET | Freq: Every day | ORAL | Status: DC
  Administered 2021-04-13: 25 ug via ORAL
  Filled 2021-04-12: qty 1

## 2021-04-12 MED ORDER — DIAZEPAM 5 MG PO TABS
5.0000 mg | ORAL_TABLET | Freq: Four times a day (QID) | ORAL | Status: DC | PRN
  Administered 2021-04-12 – 2021-04-13 (×3): 5 mg via ORAL
  Filled 2021-04-12 (×3): qty 1

## 2021-04-12 MED ORDER — THROMBIN 5000 UNITS EX SOLR
CUTANEOUS | Status: AC
  Filled 2021-04-12: qty 5000

## 2021-04-12 MED ORDER — GABAPENTIN 300 MG PO CAPS
ORAL_CAPSULE | ORAL | Status: AC
  Administered 2021-04-12: 300 mg via ORAL
  Filled 2021-04-12: qty 1

## 2021-04-12 MED ORDER — THROMBIN 20000 UNITS EX SOLR
CUTANEOUS | Status: DC | PRN
  Administered 2021-04-12: 20 mL via TOPICAL

## 2021-04-12 MED ORDER — LISINOPRIL-HYDROCHLOROTHIAZIDE 20-25 MG PO TABS: 1.0000 | ORAL_TABLET | Freq: Every day | ORAL | Status: DC

## 2021-04-12 MED ORDER — ONDANSETRON HCL 4 MG PO TABS: 4.0000 mg | ORAL_TABLET | Freq: Four times a day (QID) | ORAL | Status: DC | PRN

## 2021-04-12 MED ORDER — PRAVASTATIN SODIUM 10 MG PO TABS
20.0000 mg | ORAL_TABLET | Freq: Every day | ORAL | Status: DC
  Administered 2021-04-12: 20 mg via ORAL
  Filled 2021-04-12: qty 2

## 2021-04-12 MED ORDER — KETAMINE HCL 10 MG/ML IJ SOLN
INTRAMUSCULAR | Status: DC | PRN
  Administered 2021-04-12 (×2): 10 mg via INTRAVENOUS

## 2021-04-12 MED ORDER — CEFAZOLIN SODIUM-DEXTROSE 2-4 GM/100ML-% IV SOLN
2.0000 g | INTRAVENOUS | Status: DC
  Filled 2021-04-12: qty 100

## 2021-04-12 MED ORDER — SODIUM CHLORIDE 0.9% FLUSH: 3.0000 mL | INTRAVENOUS | Status: DC | PRN

## 2021-04-12 MED ORDER — EPHEDRINE SULFATE (PRESSORS) 50 MG/ML IJ SOLN
INTRAMUSCULAR | Status: DC | PRN
  Administered 2021-04-12 (×2): 5 mg via INTRAVENOUS

## 2021-04-12 MED ORDER — VANCOMYCIN HCL 1000 MG IV SOLR
INTRAVENOUS | Status: DC | PRN
  Administered 2021-04-12: 1000 mg

## 2021-04-12 MED ORDER — EPHEDRINE 5 MG/ML INJ
INTRAVENOUS | Status: AC
  Filled 2021-04-12: qty 5

## 2021-04-12 MED ORDER — ACETAMINOPHEN 650 MG RE SUPP: 650.0000 mg | RECTAL | Status: DC | PRN

## 2021-04-12 MED ORDER — SODIUM CHLORIDE 0.9 % IV SOLN
250.0000 mL | INTRAVENOUS | Status: DC
  Administered 2021-04-12: 250 mL via INTRAVENOUS

## 2021-04-12 MED ORDER — FENTANYL CITRATE (PF) 250 MCG/5ML IJ SOLN
INTRAMUSCULAR | Status: DC | PRN
Start: 2021-04-12 — End: 2021-04-12
  Administered 2021-04-12 (×5): 50 ug via INTRAVENOUS

## 2021-04-12 MED ORDER — SUGAMMADEX SODIUM 200 MG/2ML IV SOLN
INTRAVENOUS | Status: DC | PRN
  Administered 2021-04-12: 100 mg via INTRAVENOUS

## 2021-04-12 MED ORDER — CELECOXIB 200 MG PO CAPS
ORAL_CAPSULE | ORAL | Status: AC
Start: 2021-04-12 — End: 2021-04-12
  Administered 2021-04-12: 200 mg via ORAL
  Filled 2021-04-12: qty 1

## 2021-04-12 MED ORDER — FENTANYL CITRATE (PF) 100 MCG/2ML IJ SOLN
INTRAMUSCULAR | Status: AC
  Filled 2021-04-12: qty 2

## 2021-04-12 MED ORDER — PROPOFOL 500 MG/50ML IV EMUL
INTRAVENOUS | Status: DC | PRN
  Administered 2021-04-12: 20 ug/kg/min via INTRAVENOUS

## 2021-04-12 MED ORDER — PHENYLEPHRINE HCL (PRESSORS) 10 MG/ML IV SOLN
INTRAVENOUS | Status: DC | PRN
Start: 2021-04-12 — End: 2021-04-12
  Administered 2021-04-12: 80 ug via INTRAVENOUS

## 2021-04-12 MED ORDER — SODIUM CHLORIDE 0.9% FLUSH
3.0000 mL | Freq: Two times a day (BID) | INTRAVENOUS | Status: DC
  Administered 2021-04-12 (×2): 3 mL via INTRAVENOUS

## 2021-04-12 MED ORDER — LIDOCAINE 2% (20 MG/ML) 5 ML SYRINGE
INTRAMUSCULAR | Status: DC | PRN
  Administered 2021-04-12: 100 mg via INTRAVENOUS

## 2021-04-12 MED ORDER — CEFAZOLIN SODIUM-DEXTROSE 1-4 GM/50ML-% IV SOLN
1.0000 g | Freq: Once | INTRAVENOUS | Status: AC
  Administered 2021-04-13: 1 g via INTRAVENOUS
  Filled 2021-04-12: qty 50

## 2021-04-12 MED ORDER — ACETAMINOPHEN 160 MG/5ML PO SOLN: 325.0000 mg | ORAL | Status: DC | PRN

## 2021-04-12 MED ORDER — LISINOPRIL 20 MG PO TABS
20.0000 mg | ORAL_TABLET | Freq: Every day | ORAL | Status: DC
  Administered 2021-04-12: 20 mg via ORAL
  Filled 2021-04-12: qty 1

## 2021-04-12 MED ORDER — HYDROCODONE-ACETAMINOPHEN 10-325 MG PO TABS: 1.0000 | ORAL_TABLET | ORAL | Status: DC | PRN

## 2021-04-12 MED ORDER — CHLORHEXIDINE GLUCONATE 0.12 % MT SOLN
15.0000 mL | Freq: Once | OROMUCOSAL | Status: AC
  Administered 2021-04-12: 15 mL via OROMUCOSAL
  Filled 2021-04-12: qty 15

## 2021-04-12 MED ORDER — GABAPENTIN 300 MG PO CAPS
300.0000 mg | ORAL_CAPSULE | ORAL | Status: AC
Start: 2021-04-12 — End: 2021-04-12

## 2021-04-12 MED ORDER — FLEET ENEMA 7-19 GM/118ML RE ENEM: 1.0000 | ENEMA | Freq: Once | RECTAL | Status: DC | PRN

## 2021-04-12 MED ORDER — SODIUM CHLORIDE 0.9% IV SOLUTION: Freq: Once | INTRAVENOUS | Status: DC

## 2021-04-12 SURGICAL SUPPLY — 105 items
ANCHOR LUMBAR 25 MIS (Anchor) ×3 IMPLANT
APPLIER CLIP 11 MED OPEN (CLIP) ×3
BAG COUNTER SPONGE SURGICOUNT (BAG) ×9 IMPLANT
BAG DECANTER FOR FLEXI CONT (MISCELLANEOUS) ×5 IMPLANT
BENZOIN TINCTURE PRP APPL 2/3 (GAUZE/BANDAGES/DRESSINGS) ×4 IMPLANT
BLADE CLIPPER SURG (BLADE) IMPLANT
BUR CUTTER 7.0 ROUND (BURR) ×2 IMPLANT
BUR MATCHSTICK NEURO 3.0 LAGG (BURR) ×3 IMPLANT
CANISTER SUCT 3000ML PPV (MISCELLANEOUS) ×5 IMPLANT
CARTRIDGE OIL MAESTRO DRILL (MISCELLANEOUS) ×4 IMPLANT
CLIP APPLIE 11 MED OPEN (CLIP) ×4 IMPLANT
CLIP LIGATING EXTRA MED SLVR (CLIP) ×3 IMPLANT
CLSR STERI-STRIP ANTIMIC 1/2X4 (GAUZE/BANDAGES/DRESSINGS) ×2 IMPLANT
CNTNR URN SCR LID CUP LEK RST (MISCELLANEOUS) ×2 IMPLANT
CONN RELINE 5-6/5-6M 2H INLINE (Connector) ×3 IMPLANT
CONNECTOR RELN 5-6/5-6M 2 HL (Connector) IMPLANT
CONT SPEC 4OZ STRL OR WHT (MISCELLANEOUS) ×1
COVER BACK TABLE 60X90IN (DRAPES) ×3 IMPLANT
DECANTER SPIKE VIAL GLASS SM (MISCELLANEOUS) ×2 IMPLANT
DERMABOND ADVANCED (GAUZE/BANDAGES/DRESSINGS) ×2
DERMABOND ADVANCED .7 DNX12 (GAUZE/BANDAGES/DRESSINGS) ×2 IMPLANT
DIFFUSER DRILL AIR PNEUMATIC (MISCELLANEOUS) ×5 IMPLANT
DRAPE C-ARM 42X72 X-RAY (DRAPES) ×13 IMPLANT
DRAPE C-ARMOR (DRAPES) ×2 IMPLANT
DRAPE HALF SHEET 40X57 (DRAPES) ×1 IMPLANT
DRAPE LAPAROTOMY 100X72X124 (DRAPES) ×6 IMPLANT
DRAPE SURG 17X23 STRL (DRAPES) ×12 IMPLANT
DRSG OPSITE POSTOP 4X6 (GAUZE/BANDAGES/DRESSINGS) ×3 IMPLANT
DURAPREP 26ML APPLICATOR (WOUND CARE) ×4 IMPLANT
ELECT BLADE 4.0 EZ CLEAN MEGAD (MISCELLANEOUS) ×3
ELECT BLADE 6.5 EXT (BLADE) ×3 IMPLANT
ELECT REM PT RETURN 9FT ADLT (ELECTROSURGICAL) ×6
ELECTRODE BLDE 4.0 EZ CLN MEGD (MISCELLANEOUS) ×2 IMPLANT
ELECTRODE REM PT RTRN 9FT ADLT (ELECTROSURGICAL) ×4 IMPLANT
EVACUATOR 1/8 PVC DRAIN (DRAIN) ×2 IMPLANT
GAUZE 4X4 16PLY ~~LOC~~+RFID DBL (SPONGE) ×2 IMPLANT
GAUZE SPONGE 4X4 12PLY STRL (GAUZE/BANDAGES/DRESSINGS) ×5 IMPLANT
GLOVE EXAM NITRILE XL STR (GLOVE) IMPLANT
GLOVE SRG 8 PF TXTR STRL LF DI (GLOVE) ×2 IMPLANT
GLOVE SURG ENC MOIS LTX SZ7.5 (GLOVE) ×4 IMPLANT
GLOVE SURG LTX SZ9 (GLOVE) ×9 IMPLANT
GLOVE SURG MICRO LTX SZ7.5 (GLOVE) ×4 IMPLANT
GLOVE SURG UNDER POLY LF SZ7 (GLOVE) ×4 IMPLANT
GLOVE SURG UNDER POLY LF SZ8 (GLOVE) ×2
GOWN STRL REUS W/ TWL LRG LVL3 (GOWN DISPOSABLE) ×2 IMPLANT
GOWN STRL REUS W/ TWL XL LVL3 (GOWN DISPOSABLE) ×8 IMPLANT
GOWN STRL REUS W/TWL 2XL LVL3 (GOWN DISPOSABLE) IMPLANT
GOWN STRL REUS W/TWL LRG LVL3 (GOWN DISPOSABLE) ×2
GOWN STRL REUS W/TWL XL LVL3 (GOWN DISPOSABLE) ×4
GRAFT TRINITY ELITE LGE HUMAN (Tissue) ×1 IMPLANT
GUIDEWIRE NITINOL BEVEL TIP (WIRE) ×2 IMPLANT
INSERT FOGARTY 61MM (MISCELLANEOUS) IMPLANT
INSERT FOGARTY SM (MISCELLANEOUS) IMPLANT
KIT BASIN OR (CUSTOM PROCEDURE TRAY) ×6 IMPLANT
KIT INFUSE X SMALL 1.4CC (Orthopedic Implant) ×1 IMPLANT
KIT TURNOVER KIT B (KITS) ×3 IMPLANT
NDL I-PASS III (NEEDLE) IMPLANT
NDL SPNL 18GX3.5 QUINCKE PK (NEEDLE) ×2 IMPLANT
NEEDLE HYPO 22GX1.5 SAFETY (NEEDLE) ×5 IMPLANT
NEEDLE I-PASS III (NEEDLE) ×3 IMPLANT
NEEDLE SPNL 18GX3.5 QUINCKE PK (NEEDLE) ×3 IMPLANT
NS IRRIG 1000ML POUR BTL (IV SOLUTION) ×5 IMPLANT
OIL CARTRIDGE MAESTRO DRILL (MISCELLANEOUS) ×3
PACK LAMINECTOMY NEURO (CUSTOM PROCEDURE TRAY) ×6 IMPLANT
PAD ARMBOARD 7.5X6 YLW CONV (MISCELLANEOUS) ×6 IMPLANT
RASP HELIOCORDIAL MED (MISCELLANEOUS) ×1 IMPLANT
ROD RELIN-O LORD 5.5X65MM (Rod) ×1 IMPLANT
ROD RELINE-O LORD 5.5X40 (Rod) ×1 IMPLANT
ROD SPINAL THRD DISP (Rod) ×1 IMPLANT
SCREW LOCK RELINE 5.5 TULIP (Screw) ×4 IMPLANT
SCREW LOCK RELINE CLOSED TULIP (Screw) ×2 IMPLANT
SCREW RELINE MAAS POLY 6.5X35 (Screw) ×2 IMPLANT
SCREW RELINE MAS POLY 7.5X45MM (Screw) ×1 IMPLANT
SPACER HEDRON IA 29X39X13 8D (Spacer) ×1 IMPLANT
SPONGE INTESTINAL PEANUT (DISPOSABLE) ×6 IMPLANT
SPONGE SURGIFOAM ABS GEL 100 (HEMOSTASIS) ×7 IMPLANT
SPONGE T-LAP 18X18 ~~LOC~~+RFID (SPONGE) ×3 IMPLANT
SPONGE T-LAP 4X18 ~~LOC~~+RFID (SPONGE) ×5 IMPLANT
STAPLER VISISTAT 35W (STAPLE) ×3 IMPLANT
STRIP CLOSURE SKIN 1/2X4 (GAUZE/BANDAGES/DRESSINGS) ×6 IMPLANT
SUT PDS AB 1 CTX 36 (SUTURE) ×3 IMPLANT
SUT PROLENE 4 0 RB 1 (SUTURE)
SUT PROLENE 4-0 RB1 .5 CRCL 36 (SUTURE) IMPLANT
SUT PROLENE 5 0 CC1 (SUTURE) IMPLANT
SUT PROLENE 6 0 C 1 30 (SUTURE) ×2 IMPLANT
SUT PROLENE 6 0 CC (SUTURE) IMPLANT
SUT SILK 0 TIES 10X30 (SUTURE) ×2 IMPLANT
SUT SILK 2 0 TIES 10X30 (SUTURE) ×3 IMPLANT
SUT SILK 2 0SH CR/8 30 (SUTURE) IMPLANT
SUT SILK 3 0 SH CR/8 (SUTURE) IMPLANT
SUT SILK 3 0 TIES 17X18 (SUTURE)
SUT SILK 3 0SH CR/8 30 (SUTURE) IMPLANT
SUT SILK 3-0 18XBRD TIE BLK (SUTURE) ×2 IMPLANT
SUT VIC AB 0 CT1 18XCR BRD8 (SUTURE) ×6 IMPLANT
SUT VIC AB 0 CT1 27 (SUTURE) ×2
SUT VIC AB 0 CT1 27XBRD ANBCTR (SUTURE) ×4 IMPLANT
SUT VIC AB 0 CT1 27XBRD ANTBC (SUTURE) IMPLANT
SUT VIC AB 0 CT1 8-18 (SUTURE) ×2
SUT VIC AB 2-0 CT1 18 (SUTURE) ×7 IMPLANT
SUT VIC AB 3-0 SH 8-18 (SUTURE) ×10 IMPLANT
SUT VICRYL 4-0 PS2 18IN ABS (SUTURE) ×2 IMPLANT
TOWEL GREEN STERILE (TOWEL DISPOSABLE) ×8 IMPLANT
TOWEL GREEN STERILE FF (TOWEL DISPOSABLE) ×5 IMPLANT
TRAY FOLEY MTR SLVR 16FR STAT (SET/KITS/TRAYS/PACK) ×5 IMPLANT
WATER STERILE IRR 1000ML POUR (IV SOLUTION) ×5 IMPLANT

## 2021-04-12 NOTE — Op Note (Signed)
Date: April 12, 2021  Preoperative diagnosis: Chronic lower back pain  Postoperative diagnosis: Same  Procedure: Anterior spine exposure at the L5-S1 disc space via anterior retroperitoneal approach for L5-S1 ALIF  Surgeon: Dr. [name], MD  Co-surgeon: Dr. Earnie Larsson, MD  Indications: Patient is a 71 year old female with chronic lower back pain and has had a previous L2-L5 fusion posteriorly.  She now has bilateral fracturing of her L5 posterior elements with persistent back pain.  Vascular surgery was asked to assist with anterior spine exposure at L5-S1.  Patient presents after risk benefits discussed.  Findings: Transverse incision over the left rectus muscle where the L5-S1 disc space was marked.  Ultimately the anterior rectus sheath was opened transversely and flaps were raised underneath this and the left rectus muscle was mobilized to the midline.  Entered the retroperitoneum and the peritoneum and left ureter was mobilized across midline and identified the left iliac vessels.  Ultimately the middle sacral vessels were divided between clips.  Blunt dissection on both sides of the disc space.  Iliac vein was fairly adherent here given previous L5 screws.  Ultimately fixed retractor was placed after good working room and we confirmed we were at the correct level on lateral fluoroscopy.  Anesthesia: General  Details: Patient was taken to the operating room after informed consent was obtained.  Placed on the operating table in supine position.  General endotracheal anesthesia was induced.  Fluoroscopic C-arm was used in the lateral position to mark the L5-S1 disc space over the left rectus muscle.  The left abdominal wall was then prepped and draped in standard sterile fashion.  Timeout was performed.  Antibiotics were given.  Ultimately a transverse incision was made at our preoperative mark and opened the subcutaneous tissue with Bovie cautery and then use cerebellar  retractors.  Anterior rectus sheath was opened transversely and I raised flaps underneath the anterior rectus sheath with hemostats.  The left rectus muscle was mobilized to the midline.  Entered the retroperitoneum and bluntly mobilized peritoneum and left ureter toward the midline.  Dr. Annette Stable used hand-held Wiley retractors to pull the peritoneum and left ureter across midline.  Identified the left iliac vessels including the left iliac vein.  There was some scar tissue here from previous L5 posterior screws.  Ultimately the middle sacral vessels were divided between clips and we bluntly dissected on both sides of the disc space including mobilizing the left iliac vein.  Once we had good working room the fixed NuVasive fracture retractor was placed on the field.  120 lip retractors were placed on both sides of the disc space with a 120 lip retractor cranial and 140 lip retractor caudal.  We put a spinal needle in the disc space and confirmed we were at the correct level on lateral fluoroscopy.  Case was turned over to Dr. Annette Stable.  Please see his dictation for remainder of the case.  Complication: None  Condition: Stable  [name], MD [facility] Office: [place]

## 2021-04-12 NOTE — Progress Notes (Signed)
PHARMACY NOTE:  ANTIMICROBIAL RENAL DOSAGE ADJUSTMENT  Current antimicrobial regimen includes a mismatch between antimicrobial dosage and estimated renal function.  As per policy approved by the Pharmacy & Therapeutics and Medical Executive Committees, the antimicrobial dosage will be adjusted accordingly.  Current antimicrobial dosage:  cefazolin 1g q 8 hr  Indication: surgical prophylaxis  Renal Function:  Estimated Creatinine Clearance: 29.2 mL/min (A) (by C-G formula based on SCr of 1.4 mg/dL (H)).    Antimicrobial dosage has been changed to:  1g once   Additional comments: received vancomycin at 11am    Thank you for allowing pharmacy to be a part of this patient's care.  Benetta Spar, PharmD, BCPS, BCCP Clinical Pharmacist  Please check AMION for all [place] phone numbers After 10:00 PM, call [place] [phone]

## 2021-04-12 NOTE — H&P (Signed)
Jean Delacruz is an 71 y.o. female.   Chief Complaint: Back pain HPI: 71 year old female status post prior L2-3 L3-4 and L4-5 fusions.  Patient with worsening back pain.  Work-up demonstrates evidence of L5 pars interarticularis fractures with some instability at L5-S1.  Fracture extends into the pedicle of L5 and has loose in the left L5 pedicle screw.  Interbody fusions appear to be solid at L2-3, L3-4 and L4-5 however.  Patient presents now for L5-S1 anterior lumbar interbody fusion with posterior stabilization.  Past Medical History:  Diagnosis Date   Anxiety disorder 11/09/2013   Arthritis    Chronic pain    Depression 05/19/2014   Headache    migraines   Hypertension    Hypothyroidism    PONV (postoperative nausea and vomiting)     Past Surgical History:  Procedure Laterality Date   ABDOMINAL HYSTERECTOMY     ANTERIOR LATERAL LUMBAR FUSION WITH PERCUTANEOUS SCREW 2 LEVEL Left 02/21/2019   Procedure: Anterior Lateral Fusion Lumbar Two-Three/Lumbar Three-Four with unilateral percutaneous pedicle screws;  Surgeon: Earnie Larsson, MD;  Location: [place];  Service: Neurosurgery;  Laterality: Left;  Anterior Lateral Fusion Lumbar Two-Three/Lumbar Three-Four with percutaneous pedicle screws   BACK SURGERY     BLADDER SURGERY  2007   CESAREAN SECTION     x2   CYST EXCISION     scalp   galion cyst left hand x2     NASAL SINUS SURGERY     TONSILLECTOMY     TUBAL LIGATION      History reviewed. No pertinent family history. Social History:  reports that she has never smoked. She has never been exposed to tobacco smoke. She has never used smokeless tobacco. She reports that she does not drink alcohol and does not use drugs.  Allergies: No Known Allergies  Medications Prior to Admission  Medication Sig Dispense Refill   estradiol (ESTRACE) 0.5 MG tablet Take 0.5 mg by mouth daily.      gabapentin (NEURONTIN) 300 MG capsule Take 600 mg by mouth 3 (three) times daily.      HYDROcodone-acetaminophen (NORCO) 7.5-325 MG tablet Take 1 tablet by mouth every 8 (eight) hours as needed for moderate pain.     levothyroxine (SYNTHROID, LEVOTHROID) 25 MCG tablet Take 25 mcg by mouth daily before breakfast.      lisinopril-hydrochlorothiazide (ZESTORETIC) 20-25 MG tablet Take 1 tablet by mouth daily.      Multiple Vitamins-Minerals (PRESERVISION AREDS PO) Take 1 tablet by mouth in the morning and at bedtime.     pravastatin (PRAVACHOL) 20 MG tablet Take 20 mg by mouth at bedtime.   4   traZODone (DESYREL) 150 MG tablet Take 150 mg by mouth at bedtime.      Results for orders placed or performed during the hospital encounter of 04/12/21 (from the past 48 hour(s))  Prepare RBC (crossmatch)     Status: None   Collection Time: 04/12/21  6:41 AM  Result Value Ref Range   Order Confirmation      ORDER PROCESSED BY BLOOD BANK Performed at [facility], [address] [place], [place] [address]    No results found.  Pertinent items noted in HPI and remainder of comprehensive ROS otherwise negative.  Blood pressure 131/61, pulse 69, temperature 97.7 F (36.5 C), temperature source Oral, resp. rate 18, height 5\' 2"  (1.575 m), weight 52.2 kg, SpO2 98 %.  She is awake and alert.  She is oriented and appropriate.  Speech is  fluent.  Judgment insight are intact.  Cranial nerve function normal bilateral.  Motor examination 5/5 bilaterally sensory examination nonfocal.  Deep tendon reflexes normal active.  No evidence of long track signs.  Gait is antalgic.  Posture is mildly flexed peer examination head ears eyes nose and throat is unremarked.  Chest and abdomen are benign.  Extremities are free from injury deformity. Assessment/Plan L5 pars interarticularis fractures (traumatic spondylolysis) with intractable back pain.  Plan L5-S1 anterior lumbar body fusion utilizing anterior interbody cage, allograft and bone morphogenic protein with anterior plate fixation.  Plan posterior  fixation with S1 pedicle screws tying into her prior instrumentation.  Plan to remove the L5 pedicle screw on the left as it is now loosened.  Risks and benefits of been explained.  Patient wishes to proceed.  Jean Delacruz 04/12/2021, 7:33 AM

## 2021-04-12 NOTE — Brief Op Note (Signed)
04/12/2021  11:20 AM  PATIENT:  Jean Delacruz  71 y.o. female  PRE-OPERATIVE DIAGNOSIS:  lumbar fracture  POST-OPERATIVE DIAGNOSIS:  lumbar fracture  PROCEDURE:  Procedure(s): Anterior Lumbar Interbody Fusion - Lumbar five-Sacral one (N/A) Lumbar four - Sacral one posterior lateral fusion (N/A) ABDOMINAL EXPOSURE (N/A)  SURGEON:  Surgeon(s) and Role: Panel 1:    * Earnie Larsson, MD - Primary    * Consuella Lose, MD - Assisting Panel 2:    * [name], MD - Primary  PHYSICIAN ASSISTANT:   ASSISTANTSMearl Latin   ANESTHESIA:   general  EBL:  50 mL   BLOOD ADMINISTERED:none  DRAINS: none   LOCAL MEDICATIONS USED:  MARCAINE     SPECIMEN:  No Specimen  DISPOSITION OF SPECIMEN:  N/A  COUNTS:  YES  TOURNIQUET:  * No tourniquets in log *  DICTATION: .Dragon Dictation  PLAN OF CARE: Admit to inpatient   PATIENT DISPOSITION:  PACU - hemodynamically stable.   Delay start of Pharmacological VTE agent (>24hrs) due to surgical blood loss or risk of bleeding: yes

## 2021-04-12 NOTE — Progress Notes (Signed)
Orthopedic Tech Progress Note Patient Details:  Jean Delacruz 08-18-1950 [id number] Patient has Brace Patient ID: Jean Delacruz, female   DOB: [birth year], 71 y.o.   MRN: [id number]  Jean Delacruz 04/12/2021, 1:30 PM

## 2021-04-12 NOTE — Transfer of Care (Signed)
Immediate Anesthesia Transfer of Care Note  Patient: Jean Delacruz  Procedure(s) Performed: Anterior Lumbar Interbody Fusion - Lumbar five-Sacral one (Spine Lumbar) Lumbar four - Sacral one posterior lateral fusion (Back) ABDOMINAL EXPOSURE (Spine Lumbar)  Patient Location: PACU  Anesthesia Type:General  Level of Consciousness: awake, patient cooperative and responds to stimulation  Airway & Oxygen Therapy: Patient Spontanous Breathing  Post-op Assessment: Report given to RN and Post -op Vital signs reviewed and stable  Post vital signs: Reviewed and stable  Last Vitals:  Vitals Value Taken Time  BP 123/65 04/12/21 1158  Temp    Pulse 61 04/12/21 1159  Resp 11 04/12/21 1159  SpO2 100 % 04/12/21 1159  Vitals shown include unvalidated device data.  Last Pain:  Vitals:   04/12/21 0634  TempSrc:   PainSc: 9       Patients Stated Pain Goal: 0 (86/38/17 7116)  Complications: No notable events documented.

## 2021-04-12 NOTE — Anesthesia Postprocedure Evaluation (Signed)
Anesthesia Post Note  Patient: Jean Delacruz  Procedure(s) Performed: Anterior Lumbar Interbody Fusion - Lumbar five-Sacral one (Spine Lumbar) Lumbar four - Sacral one posterior lateral fusion (Back) ABDOMINAL EXPOSURE (Spine Lumbar)     Patient location during evaluation: PACU Anesthesia Type: General Level of consciousness: awake and alert Pain management: pain level controlled Vital Signs Assessment: post-procedure vital signs reviewed and stable Respiratory status: spontaneous breathing, nonlabored ventilation, respiratory function stable and patient connected to nasal cannula oxygen Cardiovascular status: blood pressure returned to baseline and stable Postop Assessment: no apparent nausea or vomiting Anesthetic complications: no   No notable events documented.  Last Vitals:  Vitals:   04/12/21 0555 04/12/21 1200  BP: 131/61 123/65  Pulse: 69 61  Resp: 18 12  Temp: 36.5 C (!) 36.2 C  SpO2: 98% 100%    Last Pain:  Vitals:   04/12/21 0634  TempSrc:   PainSc: 9                  Katharin Schneider

## 2021-04-12 NOTE — H&P (Signed)
History and Physical Interval Note:  04/12/2021 7:22 AM  Jean Delacruz  has presented today for surgery, with the diagnosis of lumbar fracture.  The various methods of treatment have been discussed with the patient and family. After consideration of risks, benefits and other options for treatment, the patient has consented to  Procedure(s): ALIF - L5-S1 (N/A) L4 - S1 posterior lateral fusion with Nuvasive srews and connectors (N/A) ABDOMINAL EXPOSURE (N/A) as a surgical intervention.  The patient's history has been reviewed, patient examined, no change in status, stable for surgery.  I have reviewed the patient's chart and labs.  Questions were answered to the patient's satisfaction.    L5-S1 ALIF  [name]  Patient name: Jean Delacruz       MRN: [id number]        DOB: 09/11/1950        Sex: female   REASON FOR CONSULT: Evaluate for abdominal exposure for L5-S1 ALIF   HPI: Jean Delacruz is a 71 y.o. female, with history of hypertension and chronic back pain that presents for evaluation of abdominal exposure for L5-S1 ALIF.  Patient describes chronic lower back pain since last January when she had her previous spine surgery.  This has not improved with conservative management.  In review of the notes with Dr. Trenton Gammon, she has had a previous L2-L5 fusion and now has bilateral fracturing of her L5 posterior elements with persistent back pain.  Vascular surgery has been asked to assist with abdominal exposure.  Previous abdominal surgery includes two C-sections as well as hysterectomy.       Past Medical History:  Diagnosis Date   Anxiety disorder 11/09/2013   Arthritis     Chronic pain     Depression 05/19/2014   Headache      migraines   Hypertension     Hypothyroidism     PONV (postoperative nausea and vomiting)             Past Surgical History:  Procedure Laterality Date   ABDOMINAL HYSTERECTOMY       ANTERIOR LATERAL LUMBAR FUSION WITH PERCUTANEOUS SCREW 2 LEVEL  Left 02/21/2019    Procedure: Anterior Lateral Fusion Lumbar Two-Three/Lumbar Three-Four with unilateral percutaneous pedicle screws;  Surgeon: Earnie Larsson, MD;  Location: [facility];  Service: Neurosurgery;  Laterality: Left;  Anterior Lateral Fusion Lumbar Two-Three/Lumbar Three-Four with percutaneous pedicle screws   BLADDER SURGERY   2007   CESAREAN SECTION        x2   CYST EXCISION        scalp   galion cyst left hand x2       NASAL SINUS SURGERY       TONSILLECTOMY       TUBAL LIGATION          No family history on file.   SOCIAL HISTORY: Social History         Socioeconomic History   Marital status: Married      Spouse name: Not on file   Number of children: Not on file   Years of education: Not on file   Highest education level: Not on file  Occupational History   Not on file  Tobacco Use   Smoking status: Never      Passive exposure: Never   Smokeless tobacco: Never  Vaping Use   Vaping Use: Never used  Substance and Sexual Activity   Alcohol use: No   Drug use: No   Sexual activity: Not  on file  Other Topics Concern   Not on file  Social History Narrative   Not on file    Social Determinants of Health    Financial Resource Strain: Not on file  Food Insecurity: Not on file  Transportation Needs: Not on file  Physical Activity: Not on file  Stress: Not on file  Social Connections: Not on file  Intimate Partner Violence: Not on file      No Known Allergies         Current Outpatient Medications  Medication Sig Dispense Refill   busPIRone (BUSPAR) 5 MG tablet Take 5 mg by mouth 3 (three) times daily.       diazepam (VALIUM) 5 MG tablet Take 1-2 tablets (5-10 mg total) by mouth every 6 (six) hours as needed for muscle spasms. 35 tablet 0   DULoxetine (CYMBALTA) 60 MG capsule Take 60 mg by mouth daily.       estradiol (ESTRACE) 0.5 MG tablet Take 0.5 mg by mouth daily.        gabapentin (NEURONTIN) 300 MG capsule         HYDROcodone-acetaminophen (NORCO)  7.5-325 MG tablet         levothyroxine (SYNTHROID, LEVOTHROID) 25 MCG tablet Take 25 mcg by mouth daily before breakfast.        lisinopril-hydrochlorothiazide (ZESTORETIC) 20-25 MG tablet Take 1 tablet by mouth daily.        oxyCODONE 10 MG TABS Take 1 tablet (10 mg total) by mouth every 3 (three) hours as needed for severe pain ((score 7 to 10)). 30 tablet 0   pravastatin (PRAVACHOL) 20 MG tablet Take 20 mg by mouth at bedtime.    4   traZODone (DESYREL) 150 MG tablet Take 150 mg by mouth at bedtime.        No current facility-administered medications for this visit.      REVIEW OF SYSTEMS:  [X]  denotes positive finding, [ ]  denotes negative finding Cardiac   Comments:  Chest pain or chest pressure:      Shortness of breath upon exertion:      Short of breath when lying flat:      Irregular heart rhythm:             Vascular      Pain in calf, thigh, or hip brought on by ambulation:      Pain in feet at night that wakes you up from your sleep:       Blood clot in your veins:      Leg swelling:              Pulmonary      Oxygen at home:      Productive cough:       Wheezing:              Neurologic      Sudden weakness in arms or legs:       Sudden numbness in arms or legs:       Sudden onset of difficulty speaking or slurred speech:      Temporary loss of vision in one eye:       Problems with dizziness:              Gastrointestinal      Blood in stool:       Vomited blood:              Genitourinary      Burning when urinating:  Blood in urine:             Psychiatric      Major depression:              Hematologic      Bleeding problems:      Problems with blood clotting too easily:             Skin      Rashes or ulcers:             Constitutional      Fever or chills:          PHYSICAL EXAM:    Vitals:    04/02/21 1327  BP: 105/66  Pulse: 75  Resp: 20  Temp: 98.2 F (36.8 C)  TempSrc: Temporal  SpO2: 97%  Weight: 115 lb (52.2 kg)   Height: 5\' 2"  (1.575 m)      GENERAL: The patient is a well-nourished female, in no acute distress. The vital signs are documented above. CARDIAC: There is a regular rate and rhythm.  VASCULAR:  Palpable femoral pulses bilaterally Palpable DP pulses bilateral PULMONARY: No respiratory distress. ABDOMEN: Soft and non-tender.  Lower midline incision from previous C-section. MUSCULOSKELETAL: There are no major deformities or cyanosis. NEUROLOGIC: No focal weakness or paresthesias are detected. SKIN: There are no ulcers or rashes noted. PSYCHIATRIC: The patient has a normal affect.   DATA:    CT Lumbar Spine reviewed from 02/14/20:       Assessment/Plan:   71 year old female with chronic lower back pain status post L2-L5 fusion with new bilateral fracturing of her L5 posterior elements with persistent back pain that vascular surgery has been asked to assist with L5-S1 abdominal exposure for L5-S1 ALIF.  I have reviewed the patient's CT scan as well as examined her abdomen and I think she would be a good candidate for anterior approach.  I discussed a transverse incision over the left rectus muscle with mobilization of the left rectus and entering the retroperitoneum to mobilize peritoneum and left ureter to the midline.  Discussed mobilizing iliac artery and vein to expose the disc space from the front.  Discussed risk of injury to the above structures.  Look forward to assisting Dr. Trenton Gammon and she is scheduled for next week on 04/12/2021.  All questions answered.     [name], MD [facility] Office: [phone]

## 2021-04-12 NOTE — Anesthesia Procedure Notes (Signed)
Procedure Name: Intubation Date/Time: 04/12/2021 8:01 AM Performed by: Glynda Jaeger, CRNA Pre-anesthesia Checklist: Patient identified, Patient being monitored, Timeout performed, Emergency Drugs available and Suction available Patient Re-evaluated:Patient Re-evaluated prior to induction Oxygen Delivery Method: Circle System Utilized Preoxygenation: Pre-oxygenation with 100% oxygen Induction Type: IV induction Ventilation: Mask ventilation without difficulty Laryngoscope Size: Mac and 4 Grade View: Grade I Tube type: Oral Tube size: 7.5 mm Number of attempts: 1 Airway Equipment and Method: Stylet Placement Confirmation: ETT inserted through vocal cords under direct vision, positive ETCO2 and breath sounds checked- equal and bilateral Secured at: 24 cm Tube secured with: Tape Dental Injury: Teeth and Oropharynx as per pre-operative assessment

## 2021-04-12 NOTE — Op Note (Signed)
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    Revision posterior lateral fusion L4-5 and posterior lateral fusion L5-S1 utilizing segmental pedicle screw fixation and local autografting.  Surgeon:Luretha Eberly A.Amaiyah Nordhoff, M.D.  Asst. Surgeon: Kathyrn Sheriff, MD; Reinaldo Meeker, NP  Vascular surgeon: Carlis Abbott, MD  Anesthesia: General  Indication: 71 year old female remotely status post L2-3 and L3-4 decompression and fusion as well as L4-5 decompression and fusion.  Patient presents with increased back pain.  Work-up demonstrates evidence of fracturing through her pars interarticularis bilaterally at L5 with extension into her left-sided L5 pedicle and some loosening of her L5 pedicle screw.  Interbody fusion appears solid at L2-3, L3-4 and L4-5.  She presents now for L5-S1 anterior posterior fusion surgery.  Operative note: After induction anesthesia, patient positioned supine with her arms outstretched and appropriately padded.  Lower abdominal region prepped and draped sterilely.  Dr. Carlis Abbott of vascular surgery performed exposure through left paramedian transverse incision.  Retroperitoneal approach to the L5-S1 disc space was performed.  NuVasive retractor system was placed.  Fluoroscopic images confirmed the proper level and marked midline.  I then performed a discectomy by incising the disc and removing the disc down to the level of the posterior logical ligament using various curettes and Kerrison rongeurs.  Disc base was fully prepared for interbody fusion.  The space was then sized.  A globus medical 13 mm large interbody  titanium cage with an 8 degree lordotic angle was selected.  This was packed with Trinity allograft matrix and bone morphogenic protein.  This was then impacted in the place.  Anterior plate was then secured using locking stays 1 into the body of L5 into into the body of S1.  Final images reveal good position of the cage and the hardware at the proper upper level with normal alignment of the spine.  The retractors were removed.  Hemostasis was excellent.  The anterior rectus sheath was reapproximated using 2-0 Vicryl suture in a running fashion.  Skin is then closed in layers of Vicryl sutures.  Steri-Strips and sterile dressing were applied.  The patient was then turned prone and placed on the bolsters with her pressure points padded.  Patient's lumbar region was prepped and draped sterilely.  Paramedian incisions made overlying the existing instrumentation at L4-5 bilaterally were performed.  This was carried down sharply to S1.  Fluoroscopy was used.  On the left at L4-5 the rod was cut superior to the L5 screw.  The L5 screw was grossly loose and and removed.  Screw was not replaced on the left at L5.  Pedicle entry point for S1 was then identified using intraoperative landmarks and AP and lateral fluoroscopic guidance.  A Jamshidi needle introducer was then passed into the pedicle.  A guidewire was passed into the S1 body and confirmed to be in good position with both AP and lateral fluoroscopy.  The screw hole was tapped and then a 6.5 x 35 mm NuVasive screw was placed on the left side.  An end to end connector was then placed onto the rod protruding from L4.  This was connected with another section of rod which engaged the screw head of S1.  The connector was locked  in place.  Locking caps placed over the screw heads.  Locking caps were then given a final tightening.  Patient then placed to the patient's right side.  Previously placed pedicle screw instrumentation at L4-5 was dissected free as was a pedicle  entry site and S1.  Once again using AP and lateral fluoroscopic guidance a entry point into the S1 pedicle was made using a Jamshidi introducer.  Guidewire was placed.  The hole was tapped.  This was found to be solidly within bone.  6.5 x 35 mm screw was once again placed.  The previously placed pedicle screws rotation at L4-5 was disassembled.  There was some looseness of the L5 screw and this was removed and replaced with a 7.5 mm x 45 mm NuVasive screw.  The L4 screw was solid.  Short segment of titanium rods and placed over the screw heads at L4-L5 and S1.  Locking caps were placed over the screws and locking caps were then engaged with a construct under compression.  Final images reveal good position of the cage and the hardware at the proper upper level with normal alignment of the spine.  The facet and fracture on the right side at L4-5 and S1 was further decorticated.  Trinity allograft was packed posterior laterally for later fusion.  Vancomycin powder was placed in deep wound space.  Each wound was then closed in layers with Vicryl sutures.  Steri-Strips and sterile dressing were applied.  No apparent complications.  Patient tolerated the procedure well and she returns to the recovery room postop.

## 2021-04-13 LAB — TYPE AND SCREEN
ABO/RH(D): A POS
Antibody Screen: NEGATIVE
Unit division: 0
Unit division: 0

## 2021-04-13 LAB — BPAM RBC
Blood Product Expiration Date: 202303042359
Blood Product Expiration Date: 202303042359
Unit Type and Rh: 6200
Unit Type and Rh: 6200

## 2021-04-13 MED ORDER — DIAZEPAM 5 MG PO TABS
5.0000 mg | ORAL_TABLET | Freq: Three times a day (TID) | ORAL | 0 refills | Status: AC | PRN
Start: 2021-04-13 — End: 2021-04-20

## 2021-04-13 MED ORDER — OXYCODONE HCL 10 MG PO TABS: 10.0000 mg | ORAL_TABLET | ORAL | 0 refills | Status: AC | PRN

## 2021-04-13 NOTE — Progress Notes (Signed)
[facility]  Subjective  -no complaints this morning.   Objective (!) 98/54 72 98.2 F (36.8 C) (Oral) 20 97%  Intake/Output Summary (Last 24 hours) at 04/13/2021 0915 Last data filed at 04/12/2021 2100 Gross per 24 hour  Intake 2250 ml  Output 1435 ml  Net 815 ml    Abdomen with appropriate postop incisional tenderness Lower abdominal incision clean dry and intact. Palpable AT pulses bilaterally  Laboratory Lab Results: No results for input(s): WBC, HGB, HCT, PLT in the last 72 hours. BMET No results for input(s): NA, K, CL, CO2, GLUCOSE, BUN, CREATININE, CALCIUM in the last 72 hours.  COAG No results found for: INR, PROTIME No results found for: PTT  Assessment/Planning:  Postop day 1 status post anterior spine exposure at L5-S1 for L5-S1 ALIF.  Looks good from my standpoint.  Appropriate postop incisional tenderness with no nausea or vomiting.  Palpable AT pulses bilaterally.  [name] 04/13/2021 9:15 AM --

## 2021-04-13 NOTE — Discharge Summary (Signed)
Physician Discharge Summary  Patient ID: Jean Delacruz MRN: [id number] DOB/AGE: [birth year] 72 y.o.  Admit date: 04/12/2021 Discharge date: 04/13/2021  Admission Diagnoses:  L5 fracture  Discharge Diagnoses:  Same Principal Problem:   L5 vertebral fracture [facility])   Discharged Condition: Stable  Hospital Course:  Jean Delacruz is a 71 y.o. female admitted after A/P L5-S1 fusion. She is doing well postoperatively with some improvement in her back pain and minimal right leg pain. She is passing flatus, tolerating diet, voiding normally with pain under control with oral medication.  Treatments: Surgery - L5-S1 A/P fusion  Discharge Exam: Blood pressure (!) 98/54, pulse 72, temperature 98.2 F (36.8 C), temperature source Oral, resp. rate 20, height 5\' 2"  (1.575 m), weight 52.2 kg, SpO2 97 %. Awake, alert, oriented Speech fluent, appropriate CN grossly intact 5/5 BUE/BLE Wound c/d/i  Disposition: Discharge disposition: 01-Home or Self Care       Discharge Instructions     Call MD for:  redness, tenderness, or signs of infection (pain, swelling, redness, odor or green/yellow discharge around incision site)   Complete by: As directed    Call MD for:  temperature >100.4   Complete by: As directed    Diet - low sodium heart healthy   Complete by: As directed    Discharge instructions   Complete by: As directed    Walk at home as much as possible, at least 4 times / day   Increase activity slowly   Complete by: As directed    Lifting restrictions   Complete by: As directed    No lifting > 10 lbs   May shower / Bathe   Complete by: As directed    48 hours after surgery   May walk up steps   Complete by: As directed    Other Restrictions   Complete by: As directed    No bending/twisting at waist   Remove dressing in 24 hours   Complete by: As directed       Allergies as of 04/13/2021   No Known Allergies      Medication List     STOP taking these  medications    HYDROcodone-acetaminophen 7.5-325 MG tablet Commonly known as: NORCO       TAKE these medications    diazepam 5 MG tablet Commonly known as: VALIUM Take 1 tablet (5 mg total) by mouth every 8 (eight) hours as needed for up to 7 days for muscle spasms.   estradiol 0.5 MG tablet Commonly known as: ESTRACE Take 0.5 mg by mouth daily.   gabapentin 300 MG capsule Commonly known as: NEURONTIN Take 600 mg by mouth 3 (three) times daily.   levothyroxine 25 MCG tablet Commonly known as: SYNTHROID Take 25 mcg by mouth daily before breakfast.   lisinopril-hydrochlorothiazide 20-25 MG tablet Commonly known as: ZESTORETIC Take 1 tablet by mouth daily.   Oxycodone HCl 10 MG Tabs Take 1 tablet (10 mg total) by mouth every 4 (four) hours as needed for up to 7 days for severe pain ((score 7 to 10)).   pravastatin 20 MG tablet Commonly known as: PRAVACHOL Take 20 mg by mouth at bedtime.   PRESERVISION AREDS PO Take 1 tablet by mouth in the morning and at bedtime.   traZODone 150 MG tablet Commonly known as: DESYREL Take 150 mg by mouth at bedtime.               Durable Medical Equipment  (From admission, onward)  Start     Ordered   04/12/21 1305  DME Walker rolling  Once       Question:  Patient needs a walker to treat with the following condition  Answer:  L5 vertebral fracture ([place])   04/12/21 1304   04/12/21 1305  DME 3 n 1  Once        04/12/21 1304            Follow-up Information     Earnie Larsson, MD Follow up in 3 week(s).   Specialty: Neurosurgery Contact information: [address] [place] [address] [phone]                 Signed: Jairo Ben 04/13/2021, 9:51 AM

## 2021-04-13 NOTE — Evaluation (Signed)
Occupational Therapy Evaluation Patient Details Name: Jean Delacruz MRN: [id number] DOB: Feb 02, 1951 Today's Date: 04/13/2021   History of Present Illness 71 y/o female admitted on 04/12/21 following ALIF L5-S1 and PLIF L4-S1. PMH HTN, Hypothyroidism, hx of lumbar fusion L2-4   Clinical Impression    Jean Delacruz was evaluated s/p the above back surgery, she is indep at baseline including driving. She lives in a 2 level home with her husband who can assist as needed at d/c. After review, pt verbalized great understanding of back precautions. She demonstrated good ability to complete ADLs using compensatory techniques to maintain precautions and verbalized understanding of brace wear/care schedule. Pt does not require further OT services. Recommend d/c to home with support of family.      Recommendations for follow up therapy are one component of a multi-disciplinary discharge planning process, led by the attending physician.  Recommendations may be updated based on patient status, additional functional criteria and insurance authorization.   Follow Up Recommendations  No OT follow up    Assistance Recommended at Discharge Intermittent Supervision/Assistance  Patient can return home with the following A little help with walking and/or transfers;Assist for transportation;A little help with bathing/dressing/bathroom    Functional Status Assessment  Patient has had a recent decline in their functional status and demonstrates the ability to make significant improvements in function in a reasonable and predictable amount of time.  Equipment Recommendations  BSC/3in1       Precautions / Restrictions Precautions Precautions: Back Precaution Booklet Issued: Yes (comment) Required Braces or Orthoses: Spinal Brace Spinal Brace: Lumbar corset Restrictions Weight Bearing Restrictions: No      Mobility Bed Mobility Overal bed mobility: Needs Assistance Bed Mobility: Rolling, Sidelying to  Sit Rolling: Supervision Sidelying to sit: Supervision       General bed mobility comments: cues for log roll    Transfers Overall transfer level: Needs assistance Equipment used: Rolling walker (2 wheels) Transfers: Sit to/from Stand Sit to Stand: Supervision                  Balance Overall balance assessment: No apparent balance deficits (not formally assessed)         ADL either performed or assessed with clinical judgement   ADL Overall ADL's : Needs assistance/impaired                 General ADL Comments: Pt required supervision for all ADLs and mobility this session without RW, however recommend RW for longer distances and bathroom transfers. Pt demonstrated great understanding of compensatory techniques for ADLs to maintain back precautions.     Vision Baseline Vision/History: 0 No visual deficits Ability to See in Adequate Light: 0 Adequate Patient Visual Report: No change from baseline Vision Assessment?: No apparent visual deficits            Pertinent Vitals/Pain Pain Assessment Pain Assessment: Faces Faces Pain Scale: Hurts even more Pain Location: back and abdomen Pain Descriptors / Indicators: Operative site guarding, Discomfort Pain Intervention(s): Monitored during session     Hand Dominance     Extremity/Trunk Assessment Upper Extremity Assessment Upper Extremity Assessment: Generalized weakness   Lower Extremity Assessment Lower Extremity Assessment: Generalized weakness   Cervical / Trunk Assessment Cervical / Trunk Assessment: Back Surgery   Communication Communication Communication: No difficulties   Cognition Arousal/Alertness: Awake/alert Behavior During Therapy: WFL for tasks assessed/performed Overall Cognitive Status: Within Functional Limits for tasks assessed  General Comments  VSS on RA, pt's husband present     Home Living Family/patient expects to be discharged to:: Private  residence Living Arrangements: Spouse/significant other Available Help at Discharge: Family Type of Home: House Home Access: Stairs to enter [employer] of Steps: 2   [place]: Two level;Able to live on main level with bedroom/bathroom     Bathroom Shower/Tub: Teacher, early years/pre: Standard     Home Equipment: Conservation officer, nature (2 wheels)          Prior Functioning/Environment Prior Level of Function : Independent/Modified Independent             Mobility Comments: no AD ADLs Comments: drives        OT Problem List: Decreased strength;Decreased range of motion;Impaired balance (sitting and/or standing);Decreased activity tolerance;Decreased safety awareness;Decreased knowledge of precautions;Pain      OT Treatment/Interventions: Self-care/ADL training;Therapeutic exercise;Balance training;Patient/family education;DME and/or AE instruction;Therapeutic activities    OT Goals(Current goals can be found in the care plan section) Acute Rehab OT Goals Patient Stated Goal: home OT Goal Formulation: All assessment and education complete, DC therapy Time For Goal Achievement: 04/13/21 Potential to Achieve Goals: Good  OT Frequency: Min 2X/week       AM-PAC OT "6 Clicks" Daily Activity     Outcome Measure Help from another person eating meals?: None Help from another person taking care of personal grooming?: A Little Help from another person toileting, which includes using toliet, bedpan, or urinal?: A Little Help from another person bathing (including washing, rinsing, drying)?: A Little Help from another person to put on and taking off regular upper body clothing?: None Help from another person to put on and taking off regular lower body clothing?: A Little 6 Click Score: 20   End of Session Equipment Utilized During Treatment: Back brace Nurse Communication: Mobility status  Activity Tolerance: Patient tolerated treatment well Patient  left: in bed;with family/visitor present  OT Visit Diagnosis: Other abnormalities of gait and mobility (R26.89);Muscle weakness (generalized) (M62.81);Pain                Time: 1583-0940 OT Time Calculation (min): 17 min Charges:  OT General Charges $OT Visit: 1 Visit OT Evaluation $OT Eval Low Complexity: 1 Low   Cheron Pasquarelli A Ayvin Lipinski 04/13/2021, 10:15 AM

## 2021-04-13 NOTE — Evaluation (Signed)
Physical Therapy Evaluation & Discharge Patient Details Name: Jean Delacruz MRN: [id number] DOB: [birth year] Today's Date: 04/13/2021  History of Present Illness  71 y/o female admitted on 04/12/21 following ALIF L5-S1 and PLIF L4-S1. PMH HTN, Hypothyroidism, hx of lumbar fusion L2-4  Clinical Impression  Patient admitted following above procedure. Patient functioning at modI level with use of RW which is patient's preference due to current pain level. Educated patient on back precautions, brace wear, and progressive walking program, patient verbalized understanding. Brace not present on eval but cleared to mobilize by MD without at this time. No further skilled PT needs required acutely. No PT follow up recommended at this time.        Recommendations for follow up therapy are one component of a multi-disciplinary discharge planning process, led by the attending physician.  Recommendations may be updated based on patient status, additional functional criteria and insurance authorization.  Follow Up Recommendations No PT follow up    Assistance Recommended at Discharge PRN  Patient can return home with the following       Equipment Recommendations None recommended by PT  Recommendations for Other Services       Functional Status Assessment Patient has had a recent decline in their functional status and demonstrates the ability to make significant improvements in function in a reasonable and predictable amount of time.     Precautions / Restrictions Precautions Precautions: Back Precaution Booklet Issued: Yes (comment) Required Braces or Orthoses: Spinal Brace Spinal Brace: Lumbar corset (not present on eval but cleared to mobilize for now without) Restrictions Weight Bearing Restrictions: No      Mobility  Bed Mobility Overal bed mobility: Modified Independent             General bed mobility comments: instructed on log roll technique    Transfers Overall transfer  level: Modified independent Equipment used: Rolling [name] (2 wheels)                    Ambulation/Gait Ambulation/Gait assistance: Modified independent (Device/Increase time) Gait Distance (Feet): 400 Feet Assistive device: Rolling [name] (2 wheels) Gait Pattern/deviations: WFL(Within Functional Limits) Gait velocity: decreased     General Gait Details: slow guarded gait but overall modI. Prefers RW at this time due to pain  Stairs Stairs: Yes Stairs assistance: Modified independent (Device/Increase time) Stair Management: Two rails, Step to pattern, Forwards Number of Stairs: 2    Wheelchair Mobility    Modified Rankin (Stroke Patients Only)       Balance Overall balance assessment: No apparent balance deficits (not formally assessed) (reliant on RW at this due to pain)                                           Pertinent Vitals/Pain Pain Assessment Pain Assessment: Faces Faces Pain Scale: Hurts even more Pain Location: back and abdomen Pain Descriptors / Indicators: Operative site guarding, Discomfort Pain Intervention(s): Monitored during session, Repositioned    Home Living Family/patient expects to be discharged to:: Private residence Living Arrangements: Spouse/significant other Available Help at Discharge: Family Type of Home: House Home Access: Stairs to enter   Technical brewer of Steps: 2   Home Layout: Two level;Able to live on main level with bedroom/bathroom Home Equipment: Rolling [name] (2 wheels)      Prior Function Prior Level of Function : Independent/Modified Independent  Hand Dominance        Extremity/Trunk Assessment   Upper Extremity Assessment Upper Extremity Assessment: Defer to OT evaluation    Lower Extremity Assessment Lower Extremity Assessment: Generalized weakness    Cervical / Trunk Assessment Cervical / Trunk Assessment: Back Surgery  Communication    Communication: No difficulties  Cognition Arousal/Alertness: Awake/alert Behavior During Therapy: WFL for tasks assessed/performed Overall Cognitive Status: Within Functional Limits for tasks assessed                                          General Comments      Exercises     Assessment/Plan    PT Assessment Patient does not need any further PT services  PT Problem List         PT Treatment Interventions      PT Goals (Current goals can be found in the Care Plan section)  Acute Rehab PT Goals Patient Stated Goal: to go home PT Goal Formulation: All assessment and education complete, DC therapy    Frequency       Co-evaluation               AM-PAC PT "6 Clicks" Mobility  Outcome Measure Help needed turning from your back to your side while in a flat bed without using bedrails?: None Help needed moving from lying on your back to sitting on the side of a flat bed without using bedrails?: None Help needed moving to and from a bed to a chair (including a wheelchair)?: None Help needed standing up from a chair using your arms (e.g., wheelchair or bedside chair)?: None Help needed to walk in hospital room?: None Help needed climbing 3-5 steps with a railing? : None 6 Click Score: 24    End of Session   Activity Tolerance: Patient tolerated treatment well Patient left: in bed;with call bell/phone within reach Nurse Communication: Mobility status PT Visit Diagnosis: Unsteadiness on feet (R26.81);Muscle weakness (generalized) (M62.81)    Time: 8264-1583 PT Time Calculation (min) (ACUTE ONLY): 24 min   Charges:   PT Evaluation $PT Eval Low Complexity: 1 Low PT Treatments $Gait Training: 8-22 mins        Glennette Galster A. Gilford Rile PT, DPT Acute Rehabilitation Services Pager [phone] Office [phone]   Linna Hoff 04/13/2021, 9:48 AM

## 2021-04-13 NOTE — Progress Notes (Signed)
Patient alert and oriented, mae's well, voiding adequate amount of urine, swallowing without difficulty, no c/o pain at time of discharge. Patient discharged home with family. Script and discharged instructions given to patient. Patient and family stated understanding of instructions given. Patient has an appointment with Dr. Pool  

## 2021-04-13 NOTE — Care Management (Signed)
No HH needs identified. Unit staff to provide DME through floor stock.

## 2021-04-16 ENCOUNTER — Encounter (HOSPITAL_COMMUNITY): Payer: Self-pay | Admitting: Neurosurgery

## 2021-04-18 MED FILL — Sodium Chloride IV Soln 0.9%: INTRAVENOUS | Qty: 1000 | Status: AC

## 2021-04-18 MED FILL — Heparin Sodium (Porcine) Inj 1000 Unit/ML: INTRAMUSCULAR | Qty: 30 | Status: AC

## 2021-04-19 DIAGNOSIS — F411 Generalized anxiety disorder: Secondary | ICD-10-CM | POA: Diagnosis not present

## 2021-04-19 DIAGNOSIS — M79604 Pain in right leg: Secondary | ICD-10-CM | POA: Diagnosis not present

## 2021-04-19 DIAGNOSIS — N390 Urinary tract infection, site not specified: Secondary | ICD-10-CM | POA: Diagnosis not present

## 2021-04-19 DIAGNOSIS — R300 Dysuria: Secondary | ICD-10-CM | POA: Diagnosis not present

## 2021-04-19 DIAGNOSIS — F331 Major depressive disorder, recurrent, moderate: Secondary | ICD-10-CM | POA: Diagnosis not present

## 2021-04-19 DIAGNOSIS — Z6821 Body mass index (BMI) 21.0-21.9, adult: Secondary | ICD-10-CM | POA: Diagnosis not present

## 2021-05-01 ENCOUNTER — Other Ambulatory Visit: Payer: Self-pay | Admitting: Student

## 2021-05-01 DIAGNOSIS — M5416 Radiculopathy, lumbar region: Secondary | ICD-10-CM

## 2021-05-03 DIAGNOSIS — M5416 Radiculopathy, lumbar region: Secondary | ICD-10-CM | POA: Diagnosis not present

## 2021-05-03 DIAGNOSIS — M5116 Intervertebral disc disorders with radiculopathy, lumbar region: Secondary | ICD-10-CM | POA: Diagnosis not present

## 2021-05-08 DIAGNOSIS — S32058A Other fracture of fifth lumbar vertebra, initial encounter for closed fracture: Secondary | ICD-10-CM | POA: Diagnosis not present

## 2021-05-09 ENCOUNTER — Other Ambulatory Visit: Payer: Self-pay | Admitting: Neurosurgery

## 2021-05-10 ENCOUNTER — Other Ambulatory Visit: Payer: Self-pay | Admitting: Neurosurgery

## 2021-05-16 ENCOUNTER — Other Ambulatory Visit: Payer: Self-pay

## 2021-05-16 ENCOUNTER — Encounter (HOSPITAL_COMMUNITY): Payer: Self-pay | Admitting: Neurosurgery

## 2021-05-16 NOTE — Pre-Procedure Instructions (Incomplete)
SDW CALL ? ?Patient was given pre-op instructions over the phone. The opportunity was given for the patient to ask questions. No further questions asked. Patient verbalized understanding of instructions given. ? ? ?PCP - Amy Moon ?Cardiologist - denies ? ?PPM/ICD - denies ?Chest x-ray - N/A ?EKG - 04/09/21 ?Stress Test - denies ?ECHO - denies ?Cardiac Cath - denies ? ?Sleep Study - denies ? ?COVID TEST- NPO order ? ? ?Anesthesia review: no ? ?Patient denies shortness of breath, fever, cough and chest pain over the phone call ? ? ? ?Surgical Instructions ? ? ? Your procedure is scheduled on Friday, March 31 ? Report to [facility] Main Entrance "A" at 9:30 A.M., then check in with the Admitting office. ? Call this number if you have problems the morning of surgery: ? [phone] ? ? ? Remember: ? Do not eat or drink after midnight the night before your surgery ? ? Take these medicines the morning of surgery with A SIP OF WATER:  ?estradiol (ESTRACE) ?levothyroxine (SYNTHROID, LEVOTHROID)  ?pregabalin (LYRICA)  ?diazepam (VALIUM) if needed ?Oxycodone  if needed ? ?As of today, STOP taking any Aspirin (unless otherwise instructed by your surgeon) Aleve, Naproxen, Ibuprofen, Motrin, Advil, Goody's, BC's, all herbal medications, fish oil, and all vitamins. ? ?[facility] is not responsible for any belongings or valuables.  ?  ?Contacts, glasses, hearing aids, dentures or partials may not be worn into surgery, please bring cases for these belongings ?  ?Patients discharged the day of surgery will not be allowed to drive home, and someone needs to stay with them for 24 hours. ? ?SURGICAL WAITING ROOM VISITATION ?No visitors are allowed in pre-op area with patient.  ?Patients having surgery or a procedure in a hospital may have two support people in the waiting room. ?Children under the age of 36 must have an adult with them who is not the patient. ?They may stay in the waiting area during the procedure and may switch out  with other visitors. If the patient needs to stay at the hospital during part of their recovery, the visitor guidelines for inpatient rooms apply. ? ?Please refer to the [facility] website for the visitor guidelines for Inpatients (after your surgery is over and you are in a regular room).  ? ? ? ?Special instructions:   ? ?Oral Hygiene is also important to reduce your risk of infection.  Remember - BRUSH YOUR TEETH THE MORNING OF SURGERY WITH YOUR REGULAR TOOTHPASTE ? ? ?Day of Surgery: ? ?Take a shower the day of or night before with antibacterial soap. ?Wear Clean/Comfortable clothing the morning of surgery ?Do not apply any deodorants/lotions.   ?Do not wear jewelry or makeup ?Do not wear lotions, powders, perfumes/colognes, or deodorant. ?Do not shave 48 hours prior to surgery.  Men may shave face and neck. ?Do not bring valuables to the hospital. ?Do not wear nail polish, gel polish, artificial nails, or any other type of covering on natural nails (fingers and toes) ?If you have artificial nails or gel coating that need to be removed by a nail salon, please have this removed prior to surgery. Artificial nails or gel coating may interfere with anesthesia's ability to adequately monitor your vital signs. ?Remember to brush your teeth WITH YOUR REGULAR TOOTHPASTE. ? ? ? ? ? ?

## 2021-05-17 ENCOUNTER — Inpatient Hospital Stay (HOSPITAL_COMMUNITY): Payer: PPO

## 2021-05-17 ENCOUNTER — Inpatient Hospital Stay (HOSPITAL_COMMUNITY): Payer: PPO | Admitting: Anesthesiology

## 2021-05-17 ENCOUNTER — Encounter (HOSPITAL_COMMUNITY)
Admission: RE | Disposition: A | Discharge status: Home / Self Care | Payer: Self-pay | Source: Home / Self Care | Attending: Neurosurgery

## 2021-05-17 ENCOUNTER — Encounter (HOSPITAL_COMMUNITY): Payer: Self-pay | Admitting: Neurosurgery

## 2021-05-17 ENCOUNTER — Other Ambulatory Visit: Payer: Self-pay

## 2021-05-17 ENCOUNTER — Inpatient Hospital Stay (HOSPITAL_COMMUNITY)
Admission: RE | Admit: 2021-05-17 | Discharge: 2021-05-18 | DRG: 983 | Disposition: A | Discharge status: Home / Self Care | Payer: PPO | Attending: Neurosurgery | Admitting: Neurosurgery

## 2021-05-17 DIAGNOSIS — F419 Anxiety disorder, unspecified: Secondary | ICD-10-CM | POA: Diagnosis not present

## 2021-05-17 DIAGNOSIS — I10 Essential (primary) hypertension: Secondary | ICD-10-CM | POA: Diagnosis present

## 2021-05-17 DIAGNOSIS — M5416 Radiculopathy, lumbar region: Secondary | ICD-10-CM | POA: Diagnosis present

## 2021-05-17 DIAGNOSIS — G43909 Migraine, unspecified, not intractable, without status migrainosus: Secondary | ICD-10-CM | POA: Diagnosis not present

## 2021-05-17 DIAGNOSIS — M79604 Pain in right leg: Secondary | ICD-10-CM | POA: Diagnosis present

## 2021-05-17 DIAGNOSIS — M5418 Radiculopathy, sacral and sacrococcygeal region: Secondary | ICD-10-CM | POA: Diagnosis not present

## 2021-05-17 DIAGNOSIS — Z79890 Hormone replacement therapy (postmenopausal): Secondary | ICD-10-CM | POA: Diagnosis not present

## 2021-05-17 DIAGNOSIS — E039 Hypothyroidism, unspecified: Secondary | ICD-10-CM | POA: Diagnosis not present

## 2021-05-17 DIAGNOSIS — F32A Depression, unspecified: Secondary | ICD-10-CM | POA: Diagnosis present

## 2021-05-17 DIAGNOSIS — T84226A Displacement of internal fixation device of vertebrae, initial encounter: Secondary | ICD-10-CM | POA: Diagnosis not present

## 2021-05-17 DIAGNOSIS — G8929 Other chronic pain: Secondary | ICD-10-CM | POA: Diagnosis not present

## 2021-05-17 DIAGNOSIS — Z981 Arthrodesis status: Secondary | ICD-10-CM | POA: Diagnosis not present

## 2021-05-17 DIAGNOSIS — Z79899 Other long term (current) drug therapy: Secondary | ICD-10-CM | POA: Diagnosis not present

## 2021-05-17 DIAGNOSIS — G9529 Other cord compression: Secondary | ICD-10-CM

## 2021-05-17 DIAGNOSIS — M1990 Unspecified osteoarthritis, unspecified site: Secondary | ICD-10-CM | POA: Diagnosis present

## 2021-05-17 HISTORY — PX: HARDWARE REMOVAL: SHX979

## 2021-05-17 LAB — CBC
HCT: 35.9 % — ABNORMAL LOW (ref 36.0–46.0)
Hemoglobin: 11.2 g/dL — ABNORMAL LOW (ref 12.0–15.0)
MCH: 29.8 pg (ref 26.0–34.0)
MCHC: 31.2 g/dL (ref 30.0–36.0)
MCV: 95.5 fL (ref 80.0–100.0)
Platelets: 187 10*3/uL (ref 150–400)
RBC: 3.76 MIL/uL — ABNORMAL LOW (ref 3.87–5.11)
RDW: 12.4 % (ref 11.5–15.5)
WBC: 6.1 10*3/uL (ref 4.0–10.5)
nRBC: 0 % (ref 0.0–0.2)

## 2021-05-17 LAB — BASIC METABOLIC PANEL
Anion gap: 10 (ref 5–15)
BUN: 14 mg/dL (ref 8–23)
CO2: 25 mmol/L (ref 22–32)
Calcium: 9.2 mg/dL (ref 8.9–10.3)
Chloride: 101 mmol/L (ref 98–111)
Creatinine, Ser: 0.98 mg/dL (ref 0.44–1.00)
GFR, Estimated: >60 mL/min (ref >60)
Glucose, Bld: 102 mg/dL — ABNORMAL HIGH (ref 70–99)
Potassium: 3.2 mmol/L — ABNORMAL LOW (ref 3.5–5.1)
Sodium: 136 mmol/L (ref 135–145)

## 2021-05-17 LAB — SURGICAL PCR SCREEN
MRSA, PCR: POSITIVE — AB
Staphylococcus aureus: POSITIVE — AB

## 2021-05-17 SURGERY — REMOVAL, HARDWARE
Anesthesia: General | Site: Back | Laterality: Right

## 2021-05-17 MED ORDER — DIAZEPAM 5 MG PO TABS: 5.0000 mg | ORAL_TABLET | Freq: Two times a day (BID) | ORAL | Status: DC | PRN

## 2021-05-17 MED ORDER — ACETAMINOPHEN 10 MG/ML IV SOLN
INTRAVENOUS | Status: AC
  Filled 2021-05-17: qty 100

## 2021-05-17 MED ORDER — CEFAZOLIN SODIUM-DEXTROSE 2-4 GM/100ML-% IV SOLN
2.0000 g | INTRAVENOUS | Status: DC
  Filled 2021-05-17: qty 100

## 2021-05-17 MED ORDER — ACETAMINOPHEN 325 MG PO TABS
650.0000 mg | ORAL_TABLET | ORAL | Status: DC | PRN
  Administered 2021-05-18: 650 mg via ORAL
  Filled 2021-05-17 (×2): qty 2

## 2021-05-17 MED ORDER — ONDANSETRON HCL 4 MG/2ML IJ SOLN
INTRAMUSCULAR | Status: DC | PRN
  Administered 2021-05-17: 4 mg via INTRAVENOUS

## 2021-05-17 MED ORDER — LISINOPRIL 20 MG PO TABS
20.0000 mg | ORAL_TABLET | Freq: Every day | ORAL | Status: DC
  Administered 2021-05-17: 20 mg via ORAL
  Filled 2021-05-17: qty 1

## 2021-05-17 MED ORDER — MEPERIDINE HCL 25 MG/ML IJ SOLN: 6.2500 mg | INTRAMUSCULAR | Status: DC | PRN

## 2021-05-17 MED ORDER — SODIUM CHLORIDE 0.9% FLUSH: 3.0000 mL | Freq: Two times a day (BID) | INTRAVENOUS | Status: DC

## 2021-05-17 MED ORDER — AMISULPRIDE (ANTIEMETIC) 5 MG/2ML IV SOLN: 10.0000 mg | Freq: Once | INTRAVENOUS | Status: DC | PRN

## 2021-05-17 MED ORDER — SODIUM CHLORIDE 0.9% FLUSH: 3.0000 mL | INTRAVENOUS | Status: DC | PRN

## 2021-05-17 MED ORDER — SUGAMMADEX SODIUM 200 MG/2ML IV SOLN
INTRAVENOUS | Status: DC | PRN
  Administered 2021-05-17: 200 mg via INTRAVENOUS

## 2021-05-17 MED ORDER — KETOROLAC TROMETHAMINE 15 MG/ML IJ SOLN
30.0000 mg | Freq: Four times a day (QID) | INTRAMUSCULAR | Status: DC
  Administered 2021-05-17: 30 mg via INTRAVENOUS
  Filled 2021-05-17: qty 2

## 2021-05-17 MED ORDER — HYDROMORPHONE HCL 1 MG/ML IJ SOLN
0.2500 mg | INTRAMUSCULAR | Status: DC | PRN
  Administered 2021-05-17 (×4): 0.5 mg via INTRAVENOUS

## 2021-05-17 MED ORDER — LACTATED RINGERS IV SOLN: INTRAVENOUS | Status: DC

## 2021-05-17 MED ORDER — HYDROCODONE-ACETAMINOPHEN 10-325 MG PO TABS: 2.0000 | ORAL_TABLET | ORAL | Status: DC | PRN

## 2021-05-17 MED ORDER — CHLORHEXIDINE GLUCONATE CLOTH 2 % EX PADS: 6.0000 | MEDICATED_PAD | Freq: Once | CUTANEOUS | Status: DC

## 2021-05-17 MED ORDER — ACETAMINOPHEN 325 MG PO TABS: 325.0000 mg | ORAL_TABLET | Freq: Once | ORAL | Status: DC | PRN

## 2021-05-17 MED ORDER — MIDAZOLAM HCL 2 MG/2ML IJ SOLN
INTRAMUSCULAR | Status: DC | PRN
  Administered 2021-05-17: 1 mg via INTRAVENOUS

## 2021-05-17 MED ORDER — ONDANSETRON HCL 4 MG PO TABS: 4.0000 mg | ORAL_TABLET | Freq: Four times a day (QID) | ORAL | Status: DC | PRN

## 2021-05-17 MED ORDER — VANCOMYCIN HCL IN DEXTROSE 1-5 GM/200ML-% IV SOLN
1000.0000 mg | Freq: Once | INTRAVENOUS | Status: DC
Start: 2021-05-17 — End: 2021-05-17

## 2021-05-17 MED ORDER — 0.9 % SODIUM CHLORIDE (POUR BTL) OPTIME
TOPICAL | Status: DC | PRN
  Administered 2021-05-17: 1000 mL

## 2021-05-17 MED ORDER — PROPOFOL 10 MG/ML IV BOLUS
INTRAVENOUS | Status: DC | PRN
  Administered 2021-05-17: 100 mg via INTRAVENOUS

## 2021-05-17 MED ORDER — LISINOPRIL-HYDROCHLOROTHIAZIDE 20-25 MG PO TABS: 1.0000 | ORAL_TABLET | Freq: Every day | ORAL | Status: DC

## 2021-05-17 MED ORDER — MELATONIN 5 MG PO TABS
10.0000 mg | ORAL_TABLET | Freq: Every evening | ORAL | Status: DC | PRN
Start: 2021-05-17 — End: 2021-05-18

## 2021-05-17 MED ORDER — HYDROCODONE-ACETAMINOPHEN 5-325 MG PO TABS: 1.0000 | ORAL_TABLET | ORAL | Status: DC | PRN

## 2021-05-17 MED ORDER — TRAZODONE HCL 150 MG PO TABS
150.0000 mg | ORAL_TABLET | Freq: Every day | ORAL | Status: DC
  Filled 2021-05-17: qty 1

## 2021-05-17 MED ORDER — ESTRADIOL 1 MG PO TABS
0.5000 mg | ORAL_TABLET | Freq: Every morning | ORAL | Status: DC
  Administered 2021-05-18: 0.5 mg via ORAL
  Filled 2021-05-17: qty 0.5

## 2021-05-17 MED ORDER — HYDROMORPHONE HCL 1 MG/ML IJ SOLN
INTRAMUSCULAR | Status: AC
  Filled 2021-05-17: qty 1

## 2021-05-17 MED ORDER — FENTANYL CITRATE (PF) 250 MCG/5ML IJ SOLN
INTRAMUSCULAR | Status: DC | PRN
  Administered 2021-05-17 (×2): 50 ug via INTRAVENOUS
  Administered 2021-05-17 (×2): 25 ug via INTRAVENOUS

## 2021-05-17 MED ORDER — OXYCODONE HCL 5 MG PO TABS
10.0000 mg | ORAL_TABLET | ORAL | Status: DC | PRN
  Administered 2021-05-17 – 2021-05-18 (×2): 10 mg via ORAL
  Filled 2021-05-17 (×3): qty 2

## 2021-05-17 MED ORDER — PREGABALIN 75 MG PO CAPS
150.0000 mg | ORAL_CAPSULE | Freq: Two times a day (BID) | ORAL | Status: DC
Start: 2021-05-17 — End: 2021-05-18
  Administered 2021-05-17: 150 mg via ORAL
  Filled 2021-05-17: qty 2

## 2021-05-17 MED ORDER — SODIUM CHLORIDE 0.9 % IV SOLN
250.0000 mL | INTRAVENOUS | Status: DC
  Administered 2021-05-17: 250 mL via INTRAVENOUS

## 2021-05-17 MED ORDER — PROPOFOL 10 MG/ML IV BOLUS
INTRAVENOUS | Status: AC
  Filled 2021-05-17: qty 20

## 2021-05-17 MED ORDER — LEVOTHYROXINE SODIUM 25 MCG PO TABS
25.0000 ug | ORAL_TABLET | Freq: Every day | ORAL | Status: DC
  Administered 2021-05-18: 25 ug via ORAL
  Filled 2021-05-17: qty 1

## 2021-05-17 MED ORDER — BUPIVACAINE HCL (PF) 0.25 % IJ SOLN
INTRAMUSCULAR | Status: AC
  Filled 2021-05-17: qty 30

## 2021-05-17 MED ORDER — VANCOMYCIN HCL 1000 MG IV SOLR
INTRAVENOUS | Status: AC
  Filled 2021-05-17: qty 20

## 2021-05-17 MED ORDER — CEFAZOLIN SODIUM-DEXTROSE 1-4 GM/50ML-% IV SOLN
1.0000 g | Freq: Three times a day (TID) | INTRAVENOUS | Status: AC
  Administered 2021-05-17 – 2021-05-18 (×2): 1 g via INTRAVENOUS
  Filled 2021-05-17 (×2): qty 50

## 2021-05-17 MED ORDER — ORAL CARE MOUTH RINSE: 15.0000 mL | Freq: Once | OROMUCOSAL | Status: AC

## 2021-05-17 MED ORDER — MIDAZOLAM HCL 2 MG/2ML IJ SOLN
INTRAMUSCULAR | Status: AC
  Filled 2021-05-17: qty 2

## 2021-05-17 MED ORDER — MENTHOL 3 MG MT LOZG
1.0000 | LOZENGE | OROMUCOSAL | Status: DC | PRN
  Filled 2021-05-17: qty 9

## 2021-05-17 MED ORDER — ONDANSETRON HCL 4 MG/2ML IJ SOLN
4.0000 mg | Freq: Four times a day (QID) | INTRAMUSCULAR | Status: DC | PRN
  Administered 2021-05-17: 4 mg via INTRAVENOUS
  Filled 2021-05-17: qty 2

## 2021-05-17 MED ORDER — THROMBIN 20000 UNITS EX SOLR
CUTANEOUS | Status: DC | PRN
  Administered 2021-05-17: 20 mL via TOPICAL

## 2021-05-17 MED ORDER — CHLORHEXIDINE GLUCONATE 0.12 % MT SOLN
15.0000 mL | Freq: Once | OROMUCOSAL | Status: AC
  Administered 2021-05-17: 15 mL via OROMUCOSAL
  Filled 2021-05-17: qty 15

## 2021-05-17 MED ORDER — ACETAMINOPHEN 160 MG/5ML PO SOLN: 325.0000 mg | Freq: Once | ORAL | Status: DC | PRN

## 2021-05-17 MED ORDER — THROMBIN 20000 UNITS EX SOLR
CUTANEOUS | Status: AC
  Filled 2021-05-17: qty 20000

## 2021-05-17 MED ORDER — HYDROCHLOROTHIAZIDE 25 MG PO TABS
25.0000 mg | ORAL_TABLET | Freq: Every day | ORAL | Status: DC
  Administered 2021-05-17: 25 mg via ORAL
  Filled 2021-05-17: qty 1

## 2021-05-17 MED ORDER — PHENOL 1.4 % MT LIQD: 1.0000 | OROMUCOSAL | Status: DC | PRN

## 2021-05-17 MED ORDER — VANCOMYCIN HCL 1000 MG IV SOLR
INTRAVENOUS | Status: DC | PRN
  Administered 2021-05-17: 1000 mg via TOPICAL

## 2021-05-17 MED ORDER — HYDROMORPHONE HCL 1 MG/ML IJ SOLN: 1.0000 mg | INTRAMUSCULAR | Status: DC | PRN

## 2021-05-17 MED ORDER — EPHEDRINE SULFATE-NACL 50-0.9 MG/10ML-% IV SOSY
PREFILLED_SYRINGE | INTRAVENOUS | Status: DC | PRN
  Administered 2021-05-17: 5 mg via INTRAVENOUS

## 2021-05-17 MED ORDER — ACETAMINOPHEN 650 MG RE SUPP: 650.0000 mg | RECTAL | Status: DC | PRN

## 2021-05-17 MED ORDER — BUPIVACAINE HCL (PF) 0.25 % IJ SOLN
INTRAMUSCULAR | Status: DC | PRN
  Administered 2021-05-17: 20 mL

## 2021-05-17 MED ORDER — ROCURONIUM BROMIDE 10 MG/ML (PF) SYRINGE
PREFILLED_SYRINGE | INTRAVENOUS | Status: DC | PRN
  Administered 2021-05-17: 50 mg via INTRAVENOUS

## 2021-05-17 MED ORDER — FENTANYL CITRATE (PF) 250 MCG/5ML IJ SOLN
INTRAMUSCULAR | Status: AC
  Filled 2021-05-17: qty 5

## 2021-05-17 MED ORDER — ACETAMINOPHEN 10 MG/ML IV SOLN
1000.0000 mg | Freq: Once | INTRAVENOUS | Status: DC | PRN
  Administered 2021-05-17: 1000 mg via INTRAVENOUS

## 2021-05-17 MED ORDER — PRAVASTATIN SODIUM 10 MG PO TABS
20.0000 mg | ORAL_TABLET | Freq: Every day | ORAL | Status: DC
  Administered 2021-05-17: 20 mg via ORAL
  Filled 2021-05-17: qty 2

## 2021-05-17 MED ORDER — KETOROLAC TROMETHAMINE 30 MG/ML IJ SOLN
INTRAMUSCULAR | Status: AC
  Filled 2021-05-17: qty 1

## 2021-05-17 MED ORDER — KETOROLAC TROMETHAMINE 15 MG/ML IJ SOLN
15.0000 mg | Freq: Four times a day (QID) | INTRAMUSCULAR | Status: DC
Start: 2021-05-18 — End: 2021-05-18
  Administered 2021-05-18 (×2): 15 mg via INTRAVENOUS
  Filled 2021-05-17 (×2): qty 1

## 2021-05-17 MED ORDER — CYCLOBENZAPRINE HCL 10 MG PO TABS
10.0000 mg | ORAL_TABLET | Freq: Three times a day (TID) | ORAL | Status: DC | PRN
  Administered 2021-05-18: 10 mg via ORAL
  Filled 2021-05-17: qty 1

## 2021-05-17 SURGICAL SUPPLY — 51 items
BAG COUNTER SPONGE SURGICOUNT (BAG) ×3 IMPLANT
BAG DECANTER FOR FLEXI CONT (MISCELLANEOUS) ×1 IMPLANT
BAND RUBBER #18 3X1/16 STRL (MISCELLANEOUS) ×4 IMPLANT
BENZOIN TINCTURE PRP APPL 2/3 (GAUZE/BANDAGES/DRESSINGS) ×2 IMPLANT
BLADE CLIPPER SURG (BLADE) IMPLANT
BUR CUTTER 7.0 ROUND (BURR) ×2 IMPLANT
CANISTER SUCT 3000ML PPV (MISCELLANEOUS) ×2 IMPLANT
CARTRIDGE OIL MAESTRO DRILL (MISCELLANEOUS) ×1 IMPLANT
DECANTER SPIKE VIAL GLASS SM (MISCELLANEOUS) ×1 IMPLANT
DERMABOND ADHESIVE PROPEN (GAUZE/BANDAGES/DRESSINGS) ×1
DERMABOND ADVANCED (GAUZE/BANDAGES/DRESSINGS) ×1
DERMABOND ADVANCED .7 DNX12 (GAUZE/BANDAGES/DRESSINGS) ×1 IMPLANT
DERMABOND ADVANCED .7 DNX6 (GAUZE/BANDAGES/DRESSINGS) IMPLANT
DIFFUSER DRILL AIR PNEUMATIC (MISCELLANEOUS) ×2 IMPLANT
DRAPE HALF SHEET 40X57 (DRAPES) IMPLANT
DRAPE LAPAROTOMY 100X72X124 (DRAPES) ×2 IMPLANT
DRAPE MICROSCOPE LEICA (MISCELLANEOUS) ×1 IMPLANT
DRAPE SURG 17X23 STRL (DRAPES) ×4 IMPLANT
DRSG OPSITE POSTOP 4X6 (GAUZE/BANDAGES/DRESSINGS) ×1 IMPLANT
ELECT REM PT RETURN 9FT ADLT (ELECTROSURGICAL) ×2
ELECTRODE REM PT RTRN 9FT ADLT (ELECTROSURGICAL) ×1 IMPLANT
GAUZE 4X4 16PLY ~~LOC~~+RFID DBL (SPONGE) ×1 IMPLANT
GAUZE SPONGE 4X4 12PLY STRL (GAUZE/BANDAGES/DRESSINGS) ×1 IMPLANT
GLOVE EXAM NITRILE XL STR (GLOVE) IMPLANT
GLOVE SURG ENC MOIS LTX SZ6.5 (GLOVE) ×2 IMPLANT
GLOVE SURG LTX SZ9 (GLOVE) ×2 IMPLANT
GLOVE SURG UNDER POLY LF SZ6.5 (GLOVE) ×2 IMPLANT
GOWN STRL REUS W/ TWL LRG LVL3 (GOWN DISPOSABLE) IMPLANT
GOWN STRL REUS W/ TWL XL LVL3 (GOWN DISPOSABLE) ×1 IMPLANT
GOWN STRL REUS W/TWL 2XL LVL3 (GOWN DISPOSABLE) IMPLANT
GOWN STRL REUS W/TWL LRG LVL3 (GOWN DISPOSABLE)
GOWN STRL REUS W/TWL XL LVL3 (GOWN DISPOSABLE) ×1
KIT BASIN OR (CUSTOM PROCEDURE TRAY) ×2 IMPLANT
KIT TURNOVER KIT B (KITS) ×2 IMPLANT
NDL SPNL 22GX3.5 QUINCKE BK (NEEDLE) ×1 IMPLANT
NEEDLE HYPO 22GX1.5 SAFETY (NEEDLE) ×2 IMPLANT
NEEDLE SPNL 22GX3.5 QUINCKE BK (NEEDLE) ×2 IMPLANT
NS IRRIG 1000ML POUR BTL (IV SOLUTION) ×2 IMPLANT
OIL CARTRIDGE MAESTRO DRILL (MISCELLANEOUS) ×2
PACK LAMINECTOMY NEURO (CUSTOM PROCEDURE TRAY) ×2 IMPLANT
PAD ARMBOARD 7.5X6 YLW CONV (MISCELLANEOUS) ×6 IMPLANT
ROD RELIN-O LORD 5.5X65MM (Rod) ×1 IMPLANT
SCREW LOCK RELINE 5.5 TULIP (Screw) ×3 IMPLANT
SPONGE SURGIFOAM ABS GEL 100 (HEMOSTASIS) ×2 IMPLANT
SPONGE T-LAP 4X18 ~~LOC~~+RFID (SPONGE) ×1 IMPLANT
STRIP CLOSURE SKIN 1/2X4 (GAUZE/BANDAGES/DRESSINGS) ×2 IMPLANT
SUT VIC AB 2-0 CT1 18 (SUTURE) ×2 IMPLANT
SUT VIC AB 3-0 SH 8-18 (SUTURE) ×3 IMPLANT
TOWEL GREEN STERILE (TOWEL DISPOSABLE) ×2 IMPLANT
TOWEL GREEN STERILE FF (TOWEL DISPOSABLE) ×2 IMPLANT
WATER STERILE IRR 1000ML POUR (IV SOLUTION) ×2 IMPLANT

## 2021-05-17 NOTE — Brief Op Note (Signed)
05/17/2021 ? ?2:57 PM ? ?PATIENT:  Jean Delacruz  71 y.o. female ? ?PRE-OPERATIVE DIAGNOSIS:  Lumbar fracture ? ?POST-OPERATIVE DIAGNOSIS:  Lumbar fracture ? ?PROCEDURE:  Procedure(s): ?Revision of right Sacral One pedicle screw (Right) ? ?SURGEON:  Surgeon(s) and Role: ?   Earnie Larsson, MD - Primary ? ?PHYSICIAN ASSISTANT:  ? ?ASSISTANTS: Bergman,NP  ? ?ANESTHESIA:   general ? ?EBL:  10 mL  ? ?BLOOD ADMINISTERED:none ? ?DRAINS: none  ? ?LOCAL MEDICATIONS USED:  MARCAINE    ? ?SPECIMEN:  No Specimen ? ?DISPOSITION OF SPECIMEN:  N/A ? ?COUNTS:  YES ? ?TOURNIQUET:  * No tourniquets in log * ? ?DICTATION: .Dragon Dictation ? ?PLAN OF CARE: Admit for overnight observation ? ?PATIENT DISPOSITION:  PACU - hemodynamically stable. ?  ?Delay start of Pharmacological VTE agent (>24hrs) due to surgical blood loss or risk of bleeding: yes ? ?

## 2021-05-17 NOTE — Transfer of Care (Signed)
Immediate Anesthesia Transfer of Care Note ? ?Patient: Jean Delacruz ? ?Procedure(s) Performed: Revision of right Sacral One pedicle screw (Right: Back) ? ?Patient Location: PACU ? ?Anesthesia Type:General ? ?Level of Consciousness: pateint uncooperative and confused ? ?Airway & Oxygen Therapy: Patient Spontanous Breathing ? ?Post-op Assessment: Report given to RN and Post -op Vital signs reviewed and stable ? ?Post vital signs: Reviewed and stable ? ?Last Vitals:  ?Vitals Value Taken Time  ?BP 154/85 05/17/21 1512  ?Temp 36.6 ?C 05/17/21 1510  ?Pulse 77 05/17/21 1514  ?Resp 17 05/17/21 1514  ?SpO2 99 % 05/17/21 1514  ?Vitals shown include unvalidated device data. ? ?Last Pain:  ?Vitals:  ? 05/17/21 0949  ?TempSrc: Oral  ?   ? ?  ? ?Complications: No notable events documented. ?

## 2021-05-17 NOTE — H&P (Signed)
?Jean Delacruz is an 71 y.o. female.   ?Chief Complaint: Right leg pain. ?HPI: 71 year old female is status post recent L5-S1 anterior posterior lumbar fusion.  Patient with some postoperative right lower extremity radicular pain.  Work-up demonstrates evidence of a medially positioned right-sided S1 pedicle screw with compression of the right S1 nerve root.  Patient presents now for reposition of her pedicle screw. ? ?Past Medical History:  ?Diagnosis Date  ? Anxiety disorder 11/09/2013  ? Arthritis   ? Chronic pain   ? Depression 05/19/2014  ? Headache   ? migraines  ? Hypertension   ? Hypothyroidism   ? PONV (postoperative nausea and vomiting)   ? ? ?Past Surgical History:  ?Procedure Laterality Date  ? ABDOMINAL EXPOSURE N/A 04/12/2021  ? Procedure: ABDOMINAL EXPOSURE;  Surgeon: [name], MD;  Location: [facility];  Service: Vascular;  Laterality: N/A;  ? ABDOMINAL HYSTERECTOMY    ? ANTERIOR LATERAL LUMBAR FUSION WITH PERCUTANEOUS SCREW 2 LEVEL Left 02/21/2019  ? Procedure: Anterior Lateral Fusion Lumbar Two-Three/Lumbar Three-Four with unilateral percutaneous pedicle screws;  Surgeon: Earnie Larsson, MD;  Location: [facility];  Service: Neurosurgery;  Laterality: Left;  Anterior Lateral Fusion Lumbar Two-Three/Lumbar Three-Four with percutaneous pedicle screws  ? ANTERIOR LUMBAR FUSION N/A 04/12/2021  ? Procedure: Anterior Lumbar Interbody Fusion - Lumbar five-Sacral one;  Surgeon: Earnie Larsson, MD;  Location: [facility];  Service: Neurosurgery;  Laterality: N/A;  ? BACK SURGERY    ? BLADDER SURGERY  2007  ? CESAREAN SECTION    ? x2  ? CYST EXCISION    ? scalp  ? galion cyst left hand x2    ? LAMINECTOMY WITH POSTERIOR LATERAL ARTHRODESIS LEVEL 2 N/A 04/12/2021  ? Procedure: Lumbar four - Sacral one posterior lateral fusion;  Surgeon: Earnie Larsson, MD;  Location: [facility];  Service: Neurosurgery;  Laterality: N/A;  ? NASAL SINUS SURGERY    ? TONSILLECTOMY    ? TUBAL LIGATION    ? ? ?History reviewed. No pertinent family  history. ?Social History:  reports that she has never smoked. She has never been exposed to tobacco smoke. She has never used smokeless tobacco. She reports that she does not drink alcohol and does not use drugs. ? ?Allergies: No Known Allergies ? ?Medications Prior to Admission  ?Medication Sig Dispense Refill  ? diazepam (VALIUM) 5 MG tablet Take 5 mg by mouth every 12 (twelve) hours as needed for anxiety.    ? estradiol (ESTRACE) 0.5 MG tablet Take 0.5 mg by mouth in the morning.    ? levothyroxine (SYNTHROID, LEVOTHROID) 25 MCG tablet Take 25 mcg by mouth daily before breakfast.     ? lisinopril-hydrochlorothiazide (ZESTORETIC) 20-25 MG tablet Take 1 tablet by mouth daily.     ? Melatonin 10 MG TABS Take 10 mg by mouth at bedtime as needed (sleep).    ? Multiple Vitamins-Minerals (PRESERVISION AREDS PO) Take 1 tablet by mouth in the morning and at bedtime.    ? Oxycodone HCl 10 MG TABS Take 10 mg by mouth every 4 (four) hours as needed (pain.).    ? pravastatin (PRAVACHOL) 20 MG tablet Take 20 mg by mouth at bedtime.   4  ? pregabalin (LYRICA) 150 MG capsule Take 150 mg by mouth 2 (two) times daily.    ? traZODone (DESYREL) 150 MG tablet Take 150 mg by mouth at bedtime.    ? ? ?Results for orders placed or performed during the hospital encounter of 05/17/21 (from the  past 48 hour(s))  ?Basic metabolic panel per protocol     Status: Abnormal  ? Collection Time: 05/17/21  9:56 AM  ?Result Value Ref Range  ? Sodium 136 135 - 145 mmol/L  ? Potassium 3.2 (L) 3.5 - 5.1 mmol/L  ? Chloride 101 98 - 111 mmol/L  ? CO2 25 22 - 32 mmol/L  ? Glucose, Bld 102 (H) 70 - 99 mg/dL  ?  Comment: Glucose reference range applies only to samples taken after fasting for at least 8 hours.  ? BUN 14 8 - 23 mg/dL  ? Creatinine, Ser 0.98 0.44 - 1.00 mg/dL  ? Calcium 9.2 8.9 - 10.3 mg/dL  ? GFR, Estimated >60 >60 mL/min  ?  Comment: (NOTE) ?Calculated using the CKD-EPI Creatinine Equation (2021) ?  ? Anion gap 10 5 - 15  ?  Comment:  Performed at [facility], [place] [address], [place], [place] [address]  ?CBC per protocol     Status: Abnormal  ? Collection Time: 05/17/21  9:56 AM  ?Result Value Ref Range  ? WBC 6.1 4.0 - 10.5 K/uL  ? RBC 3.76 (L) 3.87 - 5.11 MIL/uL  ? Hemoglobin 11.2 (L) 12.0 - 15.0 g/dL  ? HCT 35.9 (L) 36.0 - 46.0 %  ? MCV 95.5 80.0 - 100.0 fL  ? MCH 29.8 26.0 - 34.0 pg  ? MCHC 31.2 30.0 - 36.0 g/dL  ? RDW 12.4 11.5 - 15.5 %  ? Platelets 187 150 - 400 K/uL  ? nRBC 0.0 0.0 - 0.2 %  ?  Comment: Performed at [facility], [place] [address] [place], [place] [address]  ?Surgical PCR Screen     Status: Abnormal  ? Collection Time: 05/17/21 10:31 AM  ? Specimen: Nasal Mucosa; Nasal Swab  ?Result Value Ref Range  ? MRSA, PCR POSITIVE (A) NEGATIVE  ?  Comment: RESULT CALLED TO, READ BACK BY AND VERIFIED WITH: ?Jean Delacruz, AT 1218 05/17/21 Jean Delacruz ?  ? Staphylococcus aureus POSITIVE (A) NEGATIVE  ?  Comment: (NOTE) ?The Xpert SA Assay (FDA approved for NASAL specimens in patients 76 ?years of age and older), is one component of a comprehensive ?surveillance program. It is not intended to diagnose infection nor to ?guide or monitor treatment. ?Performed at [facility], [place] [address] [place], [place] ?[address] ?  ? ?No results found. ? ?Pertinent items noted in HPI and remainder of comprehensive ROS otherwise negative. ? ?Blood pressure 115/64, pulse 68, temperature 98.1 ?F (36.7 ?C), temperature source Oral, resp. rate 18, height '5\' 2"'$  (1.575 m), weight 52.2 kg, SpO2 97 %. ? ?Awake and alert.  Oriented and appropriate.  Motor and sensory function intact aside from some mild decrease sensation in her right S1 dermatome.  Straight raising positive on the right.  Wounds healing well.  Chest and abdomen benign.  Extremities free from injury or deformity.  Head ears eyes nose and throat is unremarked. ?Assessment/Plan ?Right S1 radiculopathy secondary to compression from right S1 pedicle screw.  Plan reexploration of  right posterior lateral fusion with reposition of pedicle screw risks and benefits explained.  Patient wishes to proceed. ? ?Jean Delacruz ?05/17/2021, 1:26 PM ? ? ? ?

## 2021-05-17 NOTE — Op Note (Signed)
Date of procedure: 05/17/2021 ? ?Date of dictation: Same ? ?Service: Neurosurgery ? ?Preoperative diagnosis: Right S1 radiculopathy secondary to compression from right S1 pedicle screw ? ?Postoperative diagnosis: Same ? ?Procedure Name: Reexploration of posterior lateral arthrodesis with revision of right S1 pedicle screw ? ?Surgeon:Westin Knotts A.Brinden Kincheloe, M.D. ? ?Asst. Surgeon: Reinaldo Meeker, NP ? ?Anesthesia: General ? ?Indication: 71 year old female recently status post L5-S1 anterior lumbar to body fusion with posterior fixation.  Patient with persistent right lower extremity pain.  Work-up demonstrates evidence of medial trajectory of her right-sided S1 pedicle screw with compression of right-sided S1 nerve root secondary to medial pedicle wall breach.  Patient presents now for reposition of the right S1 pedicle screw.  No history of postural headaches.  No history of fever. ? ?Operative note: After induction anesthesia, patient edition prone onto Wilson frame properly padded.  Lumbar region prepped and draped sterilely.  Incision made on the right side overlying the previously placed instrumentation at L4-5 and S1.  Dissection performed on the right.  Screw construct dissected free.  Screws were disassembled and the rod was removed.  The S1 pedicle screw on the right side was removed.  There was no evidence of bleeding from the pedicle screw tract nor was there evidence of CSF leak.  The track was gently probed and I found no evidence of any complicating features.  Dissection was moved farther lateral.  A new entry site into the right S1 pedicle was identified.  Intraoperative fluoroscopy in the AP and lateral plane confirmed good entry site.  A pilot hole was drilled.  A pedicle awl was then passed into the right S1 pedicle and sacrum.  This was probed and found to be solidly within the bone.  Each the pedicle tract was then tapped with a screw tap.  Screw hole was probed and found to be solidly within the bone.  A 6.5 mm x  35 mm NuVasive screw was placed on the right side.  Final images confirmed good position of the screw with normal alignment of the spine.  Short segment titanium rods and placed over the screw heads at L4-5 and S1.  Locking caps placed over the screws.  Locking caps then engaged with a construct under compression.  Wound is irrigated.  Vancomycin powder was placed in deep wound space.  Wound is then closed in layers with Vicryl sutures.  Steri-Strips and sterile dressing were applied.  No apparent complications.  Patient tolerated the procedure well and she returns to recovery room postop. ? ?

## 2021-05-17 NOTE — Progress Notes (Signed)
Pts husband updated on delay and pt just went to surgery. ?

## 2021-05-17 NOTE — Plan of Care (Signed)
?  Problem: Safety: ?Goal: Ability to remain free from injury will improve ?Outcome: Completed/Met ?  ?Problem: Education: ?Goal: Ability to verbalize activity precautions or restrictions will improve ?Outcome: Completed/Met ?Goal: Knowledge of the prescribed therapeutic regimen will improve ?Outcome: Completed/Met ?Goal: Understanding of discharge needs will improve ?Outcome: Completed/Met ?  ?Problem: Activity: ?Goal: Ability to avoid complications of mobility impairment will improve ?Outcome: Completed/Met ?Goal: Ability to tolerate increased activity will improve ?Outcome: Completed/Met ?Goal: Will remain free from falls ?Outcome: Completed/Met ?  ?Problem: Activity: ?Goal: Ability to avoid complications of mobility impairment will improve ?Outcome: Completed/Met ?Goal: Ability to tolerate increased activity will improve ?Outcome: Completed/Met ?Goal: Will remain free from falls ?Outcome: Completed/Met ?  ?Problem: Bowel/Gastric: ?Goal: Gastrointestinal status for postoperative course will improve ?Outcome: Completed/Met ?  ?Problem: Clinical Measurements: ?Goal: Ability to maintain clinical measurements within normal limits will improve ?Outcome: Completed/Met ?Goal: Postoperative complications will be avoided or minimized ?Outcome: Completed/Met ?Goal: Diagnostic test results will improve ?Outcome: Completed/Met ?  ?Problem: Pain Management: ?Goal: Pain level will decrease ?Outcome: Completed/Met ?  ?Problem: Skin Integrity: ?Goal: Will show signs of wound healing ?Outcome: Completed/Met ?  ?Problem: Health Behavior/Discharge Planning: ?Goal: Identification of resources available to assist in meeting health care needs will improve ?Outcome: Completed/Met ?  ?Problem: Bladder/Genitourinary: ?Goal: Urinary functional status for postoperative course will improve ?Outcome: Completed/Met ?  ?

## 2021-05-17 NOTE — Anesthesia Preprocedure Evaluation (Addendum)
Anesthesia Evaluation  ?Patient identified by MRN, date of birth, ID band ?Patient awake ? ? ? ?Reviewed: ?Allergy & Precautions, NPO status , Patient's Chart, lab work & pertinent test results ? ?History of Anesthesia Complications ?(+) PONV and history of anesthetic complications ? ?Airway ?Mallampati: II ? ?TM Distance: >3 FB ?Neck ROM: Full ? ? ? Dental ? ?(+) Caps, Dental Advisory Given, Teeth Intact ?  ?Pulmonary ? ?  ?breath sounds clear to auscultation ? ? ? ? ? ? Cardiovascular ?hypertension, Pt. on medications ? ?Rhythm:Regular Rate:Normal ? ? ?  ?Neuro/Psych ? Headaches, PSYCHIATRIC DISORDERS Anxiety Depression Bipolar Disorder   ? GI/Hepatic ?negative GI ROS, Neg liver ROS,   ?Endo/Other  ?Hypothyroidism  ? Renal/GU ?negative Renal ROS  ? ?  ?Musculoskeletal ? ?(+) Arthritis ,  ? Abdominal ?Normal abdominal exam  (+)   ?Peds ? Hematology ?negative hematology ROS ?(+)   ?Anesthesia Other Findings ? ? Reproductive/Obstetrics ? ?  ? ? ? ? ? ? ? ? ? ? ? ? ? ?  ?  ? ? ? ? ? ? ?Anesthesia Physical ?Anesthesia Plan ? ?ASA: 3 ? ?Anesthesia Plan: General  ? ?Post-op Pain Management:   ? ?Induction: Intravenous ? ?PONV Risk Score and Plan: 4 or greater and Ondansetron, Dexamethasone, Midazolam and Scopolamine patch - Pre-op ? ?Airway Management Planned: Oral ETT ? ?Additional Equipment: None ? ?Intra-op Plan:  ? ?Post-operative Plan: Extubation in OR ? ?Informed Consent: I have reviewed the patients History and Physical, chart, labs and discussed the procedure including the risks, benefits and alternatives for the proposed anesthesia with the patient or authorized representative who has indicated his/her understanding and acceptance.  ? ? ? ? ? ?Plan Discussed with: CRNA ? ?Anesthesia Plan Comments:   ? ? ? ? ? ?Anesthesia Quick Evaluation ? ?

## 2021-05-18 MED ORDER — OXYCODONE HCL 10 MG PO TABS: 10.0000 mg | ORAL_TABLET | ORAL | 0 refills | Status: AC | PRN

## 2021-05-18 MED ORDER — CYCLOBENZAPRINE HCL 10 MG PO TABS: 10.0000 mg | ORAL_TABLET | Freq: Three times a day (TID) | ORAL | 0 refills | Status: DC | PRN

## 2021-05-18 MED ORDER — DIAZEPAM 5 MG PO TABS: 5.0000 mg | ORAL_TABLET | Freq: Two times a day (BID) | ORAL | 0 refills | Status: DC | PRN

## 2021-05-18 NOTE — Evaluation (Signed)
Physical Therapy Evaluation & Discharge ?Patient Details ?Name: Jean Delacruz ?MRN: [id number] ?DOB: 1950/12/10 ?Today's Date: 05/18/2021 ? ?History of Present Illness ? 72 y/o female admitted on 05/17/21 following revision of R S1 pedicle screw. PMH HTN, Hypothyroidism, hx of lumbar fusion L2-4 & L4-S1  ?Clinical Impression ? Patient admitted following above procedure. Patient at supervision level for mobility with HHA due to unsteadiness. Encouraged use of RW at home for stability, patient verbalized understanding. Able to negotiate flight of stairs with supervision to access full bathroom on 2nd floor. Patient with poor recall of precautions from previous sx in 03/2021 and was not following them prior to this sx. Educated patient and husband on back precautions and progressive walking program, patient verbalized understanding. No further skilled PT needs required acutely. No PT follow up recommended at this time.  ?   ? ?Recommendations for follow up therapy are one component of a multi-disciplinary discharge planning process, led by the attending physician.  Recommendations may be updated based on patient status, additional functional criteria and insurance authorization. ? ?Follow Up Recommendations No PT follow up ? ?  ?Assistance Recommended at Discharge Intermittent Supervision/Assistance  ?Patient can return home with the following ? Assistance with cooking/housework;Help with stairs or ramp for entrance;Assist for transportation ? ?  ?Equipment Recommendations None recommended by PT  ?Recommendations for Other Services ?    ?  ?Functional Status Assessment Patient has had a recent decline in their functional status and demonstrates the ability to make significant improvements in function in a reasonable and predictable amount of time.  ? ?  ?Precautions / Restrictions Precautions ?Precautions: Back ?Precaution Booklet Issued: Yes (comment) ?Spinal Brace:  (no brace needed) ?Restrictions ?Weight Bearing  Restrictions: No  ? ?  ? ?Mobility ? Bed Mobility ?  ?  ?  ?  ?  ?  ?  ?General bed mobility comments: sitting EOB on arrival ?  ? ?Transfers ?Overall transfer level: Modified independent ?Equipment used: None ?  ?  ?  ?  ?  ?  ?  ?General transfer comment: increased time and effort to complete but no assistance required ?  ? ?Ambulation/Gait ?Ambulation/Gait assistance: Supervision ?Gait Distance (Feet): 300 Feet ?Assistive device: 1 person hand held assist ?Gait Pattern/deviations: Step-through pattern, Decreased stride length, Drifts right/left ?Gait velocity: decreased ?  ?  ?General Gait Details: unsteady without UE support. Improved with single UE support. Encouraged use of RW at home for stability ? ?Stairs ?Stairs: Yes ?Stairs assistance: Supervision ?Stair Management: One rail Left, Step to pattern, Forwards (HHA on R) ?Number of Stairs: 10 ?  ? ?Wheelchair Mobility ?  ? ?Modified Rankin (Stroke Patients Only) ?  ? ?  ? ?Balance Overall balance assessment: Mild deficits observed, not formally tested ?  ?  ?  ?  ?  ?  ?  ?  ?  ?  ?  ?  ?  ?  ?  ?  ?  ?  ?   ? ? ? ?Pertinent Vitals/Pain Pain Assessment ?Pain Assessment: Faces ?Faces Pain Scale: Hurts little more ?Pain Location: back ?Pain Descriptors / Indicators: Operative site guarding, Discomfort ?Pain Intervention(s): Monitored during session  ? ? ?Home Living Family/patient expects to be discharged to:: Private residence ?Living Arrangements: Spouse/significant other ?Available Help at Discharge: Family ?Type of Home: House ?Home Access: Stairs to enter ?  ?Entrance Stairs-Number of Steps: 2 ?Alternate Level Stairs-Number of Steps: flight ?Home Layout: Two level;Able to live on main level with bedroom/bathroom ?Home  Equipment: Conservation officer, nature (2 wheels) ?   ?  ?Prior Function Prior Level of Function : Independent/Modified Independent ?  ?  ?  ?  ?  ?  ?  ?  ?  ? ? ?Hand Dominance  ?   ? ?  ?Extremity/Trunk Assessment  ? Upper Extremity Assessment ?Upper  Extremity Assessment: Defer to OT evaluation ?  ? ?Lower Extremity Assessment ?Lower Extremity Assessment: Generalized weakness ?  ? ?Cervical / Trunk Assessment ?Cervical / Trunk Assessment: Back Surgery  ?Communication  ? Communication: No difficulties  ?Cognition Arousal/Alertness: Awake/alert ?Behavior During Therapy: [facility] for tasks assessed/performed ?Overall Cognitive Status: Within Functional Limits for tasks assessed ?  ?  ?  ?  ?  ?  ?  ?  ?  ?  ?  ?  ?  ?  ?  ?  ?General Comments: poor safety awareness and decreased recall of precautions from previous session. States "no one told me those last time". ?  ?  ? ?  ?General Comments   ? ?  ?Exercises    ? ?Assessment/Plan  ?  ?PT Assessment Patient does not need any further PT services  ?PT Problem List   ? ?   ?  ?PT Treatment Interventions     ? ?PT Goals (Current goals can be found in the Care Plan section)  ?Acute Rehab PT Goals ?Patient Stated Goal: to go home ?PT Goal Formulation: All assessment and education complete, DC therapy ? ?  ?Frequency   ?  ? ? ?Co-evaluation   ?  ?  ?  ?  ? ? ?  ?AM-PAC PT "6 Clicks" Mobility  ?Outcome Measure Help needed turning from your back to your side while in a flat bed without using bedrails?: None ?Help needed moving from lying on your back to sitting on the side of a flat bed without using bedrails?: None ?Help needed moving to and from a bed to a chair (including a wheelchair)?: None ?Help needed standing up from a chair using your arms (e.g., wheelchair or bedside chair)?: None ?Help needed to walk in hospital room?: A Little ?Help needed climbing 3-5 steps with a railing? : A Little ?6 Click Score: 22 ? ?  ?End of Session   ?Activity Tolerance: Patient tolerated treatment well ?Patient left: in bed;with call bell/phone within reach ?Nurse Communication: Mobility status ?PT Visit Diagnosis: Unsteadiness on feet (R26.81);Muscle weakness (generalized) (M62.81) ?  ? ?Time: 7867-6720 ?PT Time Calculation (min) (ACUTE  ONLY): 12 min ? ? ?Charges:   PT Evaluation ?$PT Eval Low Complexity: 1 Low ?  ?  ?   ? ? ?Phineas Mcenroe A. Gilford Rile, PT, DPT ?Acute Rehabilitation Services ?Pager [phone] ?Office [phone] ? ? ?Jemmie Ledgerwood A Bern Fare ?05/18/2021, 10:27 AM ? ?

## 2021-05-18 NOTE — Discharge Summary (Addendum)
?  Physician Discharge Summary  ?Patient ID: ?Jean Delacruz ?MRN: [id number] ?DOB/AGE: 05/04/1950 71 y.o. ?Estimated body mass index is 21.03 kg/m? as calculated from the following: ?  Height as of this encounter: '5\' 2"'$  (1.575 m). ?  Weight as of this encounter: 52.2 kg. ? ? ?Admit date: 05/17/2021 ?Discharge date: 05/18/2021 ? ?Admission Diagnoses: Okay lumbar radiculopathy ? ?Discharge Diagnoses: Same ?Principal Problem: ?  Lumbar radiculopathy ? ? ?Discharged Condition: good ? ?Hospital Course: Patient was admitted to hospital underwent reexploration of yesterday with removal of right S1 pedicle screw.  Postoperative patient did fairly well with recovery dizziness in her legs with improvement of her nerve pain.  Patient stable for discharge home with scheduled follow-up ? ?Consults: ?Significant Diagnostic Studies: ?Treatments: Expiration fusion ? ? ?Discharge Exam: ?Blood pressure (!) 120/110, pulse 75, temperature 98.4 ?F (36.9 ?C), temperature source Oral, resp. rate 18, height '5\' 2"'$  (1.575 m), weight 52.2 kg, SpO2 95 %. ?Strength ?: Clinically stable strength 5 out of 5 intact ?Disposition: home ? ? ?Allergies as of 05/18/2021   ?No Known Allergies ?  ? ?  ?Medication List  ?  ? ?TAKE these medications   ? ?cyclobenzaprine 10 MG tablet ?Commonly known as: FLEXERIL ?Take 1 tablet (10 mg total) by mouth 3 (three) times daily as needed for muscle spasms. ?  ?diazepam 5 MG tablet ?Commonly known as: VALIUM ?Take 1 tablet (5 mg total) by mouth every 12 (twelve) hours as needed for anxiety. ?  ?estradiol 0.5 MG tablet ?Commonly known as: ESTRACE ?Take 0.5 mg by mouth in the morning. ?  ?levothyroxine 25 MCG tablet ?Commonly known as: SYNTHROID ?Take 25 mcg by mouth daily before breakfast. ?  ?lisinopril-hydrochlorothiazide 20-25 MG tablet ?Commonly known as: ZESTORETIC ?Take 1 tablet by mouth daily. ?  ?Melatonin 10 MG Tabs ?Take 10 mg by mouth at bedtime as needed (sleep). ?  ?Oxycodone HCl 10 MG Tabs ?Take 1 tablet  (10 mg total) by mouth every 4 (four) hours as needed (pain.). ?  ?pravastatin 20 MG tablet ?Commonly known as: PRAVACHOL ?Take 20 mg by mouth at bedtime. ?  ?pregabalin 150 MG capsule ?Commonly known as: LYRICA ?Take 150 mg by mouth 2 (two) times daily. ?  ?PRESERVISION AREDS PO ?Take 1 tablet by mouth in the morning and at bedtime. ?  ?traZODone 150 MG tablet ?Commonly known as: DESYREL ?Take 150 mg by mouth at bedtime. ?  ? ?  ? ? ? ?Signed: ?Elaina Hoops ?05/18/2021, 8:35 AM ? ? ?

## 2021-05-18 NOTE — Progress Notes (Signed)
Patient alert and oriented, mae's well, voiding adequate amount of urine, swallowing without difficulty, no c/o pain at time of discharge. Patient discharged home with family. Script and discharged instructions given to patient. Patient and family stated understanding of instructions given. Patient has an appointment with Dr. Pool in 2 weeks 

## 2021-05-18 NOTE — Discharge Instructions (Signed)
Wound Care Keep incision covered and dry for three days.  Do not put any creams, lotions, or ointments on incision. Leave steri-strips on back.  They will fall off by themselves. Activity Walk each and every day, increasing distance each day. No lifting greater than 5 lbs.  Avoid excessive neck motion. No driving for 2 weeks; may ride as a passenger locally. If provided with back brace, wear when out of bed.  It is not necessary to wear brace in bed. Diet Resume your normal diet.   Call Your Doctor If Any of These Occur Redness, drainage, or swelling at the wound.  Temperature greater than 101 degrees. Severe pain not relieved by pain medication. Incision starts to come apart. Follow Up Appt Call today for appointment in 1-2 weeks ([phone]) or for problems.  If you have any hardware placed in your spine, you will need an x-ray before your appointment.  

## 2021-05-18 NOTE — Evaluation (Signed)
Occupational Therapy Evaluation ?Patient Details ?Name: Jean Delacruz ?MRN: [id number] ?DOB: 1950-03-05 ?Today's Date: 05/18/2021 ? ? ?History of Present Illness 71 y/o female admitted on 05/17/21 following revision of R S1 pedicle screw. PMH HTN, Hypothyroidism, hx of lumbar fusion L2-4 & L4-S1  ? ?Clinical Impression ?  ?Patient evaluated by Occupational Therapy with no further acute OT needs identified. All education has been completed and the patient has no further questions. Pt is able to complete ADLs with supervision.  She requires up to mod  cues to adhere to back precautions.  Education completed.   See below for any follow-up Occupational Therapy or equipment needs. OT is signing off. Thank you for this referral. ?  ?   ? ?Recommendations for follow up therapy are one component of a multi-disciplinary discharge planning process, led by the attending physician.  Recommendations may be updated based on patient status, additional functional criteria and insurance authorization.  ? ?Follow Up Recommendations ? No OT follow up  ?  ?Assistance Recommended at Discharge Intermittent Supervision/Assistance  ?Patient can return home with the following A little help with bathing/dressing/bathroom;Assistance with cooking/housework;Assist for transportation;Help with stairs or ramp for entrance ? ?  ?Functional Status Assessment ? Patient has had a recent decline in their functional status and demonstrates the ability to make significant improvements in function in a reasonable and predictable amount of time.  ?Equipment Recommendations ? None recommended by OT  ?  ?Recommendations for Other Services   ? ? ?  ?Precautions / Restrictions Precautions ?Precautions: Back ?Precaution Booklet Issued: Yes (comment) ?Precaution Comments: Pt able to state precautions independently, but requires up to mod cues to adhere to precautions. ?Spinal Brace:  (no brace needed) ?Restrictions ?Weight Bearing Restrictions: No  ? ?   ? ?Mobility Bed Mobility ?Overal bed mobility: Needs Assistance ?Bed Mobility: Rolling, Sidelying to Sit ?Rolling: Supervision ?Sidelying to sit: Supervision ?  ?  ?  ?  ?  ? ?Transfers ?  ?  ?  ?  ?  ?  ?  ?  ?  ?  ?  ? ?  ?Balance Overall balance assessment: Mild deficits observed, not formally tested ?  ?  ?  ?  ?  ?  ?  ?  ?  ?  ?  ?  ?  ?  ?  ?  ?  ?  ?   ? ?ADL either performed or assessed with clinical judgement  ? ?ADL Overall ADL's : Needs assistance/impaired ?Eating/Feeding: Independent ?  ?Grooming: Wash/dry hands;Wash/dry face;Oral care;Brushing hair;Supervision/safety;Standing ?Grooming Details (indicate cue type and reason): able to verbalize appropriate method for oral care.  Requires cues to avoid twisting ?Upper Body Bathing: Set up;Supervision/ safety;Sitting ?  ?Lower Body Bathing: Set up;Supervison/ safety;Sit to/from stand ?Lower Body Bathing Details (indicate cue type and reason): able to perform figure 4 ?Upper Body Dressing : Supervision/safety;Set up;Sitting ?  ?Lower Body Dressing: Set up;Supervision/safety;Sit to/from stand ?Lower Body Dressing Details (indicate cue type and reason): verbal cues to avoid twisting.  Donned shoes on wrong feet and required cues to recognize error ?Toilet Transfer: Supervision/safety;Ambulation;Comfort height toilet ?Toilet Transfer Details (indicate cue type and reason): cues to avoid bending ?Toileting- Water quality scientist and Hygiene: Min guard;Sit to/from stand ?Toileting - Clothing Manipulation Details (indicate cue type and reason): cues to avoid twisting and bending ?Tub/ Shower Transfer: Tub transfer;Supervision/safety;Ambulation;BSC/3in1 ?  ?Functional mobility during ADLs: Supervision/safety;Min guard ?General ADL Comments: discussed appropriate activity level.  reviewed safety with IADLs  ? ? ? ?  Vision Patient Visual Report: No change from baseline ?Vision Assessment?: No apparent visual deficits  ?   ?Perception   ?  ?Praxis   ?  ? ?Pertinent  Vitals/Pain Pain Assessment ?Pain Assessment: Faces ?Faces Pain Scale: Hurts little more ?Pain Location: back ?Pain Descriptors / Indicators: Operative site guarding, Discomfort ?Pain Intervention(s): Monitored during session  ? ? ? ?Hand Dominance Right ?  ?Extremity/Trunk Assessment Upper Extremity Assessment ?Upper Extremity Assessment: Overall WFL for tasks assessed ?  ?Lower Extremity Assessment ?Lower Extremity Assessment: Defer to PT evaluation ?  ?Cervical / Trunk Assessment ?Cervical / Trunk Assessment: Back Surgery ?  ?Communication Communication ?Communication: No difficulties ?  ?Cognition Arousal/Alertness: Awake/alert ?Behavior During Therapy: [facility] for tasks assessed/performed ?Overall Cognitive Status: Within Functional Limits for tasks assessed ?  ?  ?  ?  ?  ?  ?  ?  ?  ?  ?  ?  ?  ?  ?  ?  ?General Comments: Pt demonstrates poor safety awarness and decreased awareness of back precautions.  She will demonstrate correct method for ADLs ?  ?  ?General Comments    ? ?  ?Exercises   ?  ?Shoulder Instructions    ? ? ?Home Living Family/patient expects to be discharged to:: Private residence ?Living Arrangements: Spouse/significant other ?Available Help at Discharge: Family ?Type of Home: House ?Home Access: Stairs to enter ?Entrance Stairs-Number of Steps: 2 ?  ?Home Layout: Two level;Able to live on main level with bedroom/bathroom ?Alternate Level Stairs-Number of Steps: flight ?Alternate Level Stairs-Rails: Left ?Bathroom Shower/Tub: Tub/shower unit ?  ?Bathroom Toilet: Standard ?  ?  ?Home Equipment: Conservation officer, nature (2 wheels) ?  ?  ?  ? ?  ?Prior Functioning/Environment Prior Level of Function : Independent/Modified Independent ?  ?  ?  ?  ?  ?  ?  ?  ?  ? ?  ?  ?OT Problem List: Decreased knowledge of precautions;Pain ?  ?   ?OT Treatment/Interventions:    ?  ?OT Goals(Current goals can be found in the care plan section) Acute Rehab OT Goals ?Patient Stated Goal: to go home and to heal up well ?OT  Goal Formulation: All assessment and education complete, DC therapy  ?OT Frequency:   ?  ? ?Co-evaluation   ?  ?  ?  ?  ? ?  ?AM-PAC OT "6 Clicks" Daily Activity     ?Outcome Measure Help from another person eating meals?: None ?Help from another person taking care of personal grooming?: A Little ?Help from another person toileting, which includes using toliet, bedpan, or urinal?: A Little ?Help from another person bathing (including washing, rinsing, drying)?: A Little ?Help from another person to put on and taking off regular upper body clothing?: A Little ?Help from another person to put on and taking off regular lower body clothing?: A Little ?6 Click Score: 19 ?  ?End of Session Nurse Communication: Mobility status ? ?Activity Tolerance: Patient tolerated treatment well ?Patient left: in bed (EOB) ? ?OT Visit Diagnosis: Pain ?Pain - part of body:  (back)  ?              ?Time: 3267-1245 ?OT Time Calculation (min): 23 min ?Charges:  OT General Charges ?$OT Visit: 1 Visit ?OT Evaluation ?$OT Eval Low Complexity: 1 Low ?OT Treatments ?$Self Care/Home Management : 8-22 mins ? ?Anaysha Andre C., OTR/L ?Acute Rehabilitation Services ?Pager [phone] ?Office [phone] ? ? ?Jean Delacruz ?05/18/2021, 10:41 AM ?

## 2021-05-20 ENCOUNTER — Encounter (HOSPITAL_COMMUNITY): Payer: Self-pay | Admitting: Neurosurgery

## 2021-05-20 NOTE — Anesthesia Postprocedure Evaluation (Addendum)
Anesthesia Post Note ? ?Patient: Jean Delacruz ? ?Procedure(s) Performed: Revision of right Sacral One pedicle screw (Right: Back) ? ?  ? ?Patient location during evaluation: PACU ?Anesthesia Type: General ?Level of consciousness: awake and alert ?Pain management: pain level controlled ?Vital Signs Assessment: post-procedure vital signs reviewed and stable ?Respiratory status: spontaneous breathing, nonlabored ventilation, respiratory function stable and patient connected to nasal cannula oxygen ?Cardiovascular status: blood pressure returned to baseline and stable ?Postop Assessment: no apparent nausea or vomiting ?Anesthetic complications: no ? ? ?No notable events documented. ?  ?  ?  ?  ?  ?  ? ?Effie Berkshire ? ? ? ? ?

## 2021-06-21 DIAGNOSIS — Z0000 Encounter for general adult medical examination without abnormal findings: Secondary | ICD-10-CM | POA: Diagnosis not present

## 2021-06-21 DIAGNOSIS — E785 Hyperlipidemia, unspecified: Secondary | ICD-10-CM | POA: Diagnosis not present

## 2021-06-21 DIAGNOSIS — Z1331 Encounter for screening for depression: Secondary | ICD-10-CM | POA: Diagnosis not present

## 2021-06-21 DIAGNOSIS — Z9181 History of falling: Secondary | ICD-10-CM | POA: Diagnosis not present

## 2021-06-26 DIAGNOSIS — M5416 Radiculopathy, lumbar region: Secondary | ICD-10-CM | POA: Diagnosis not present

## 2021-06-28 DIAGNOSIS — F411 Generalized anxiety disorder: Secondary | ICD-10-CM | POA: Diagnosis not present

## 2021-06-28 DIAGNOSIS — M545 Low back pain, unspecified: Secondary | ICD-10-CM | POA: Diagnosis not present

## 2021-06-28 DIAGNOSIS — E785 Hyperlipidemia, unspecified: Secondary | ICD-10-CM | POA: Diagnosis not present

## 2021-06-28 DIAGNOSIS — G8929 Other chronic pain: Secondary | ICD-10-CM | POA: Diagnosis not present

## 2021-06-28 DIAGNOSIS — E039 Hypothyroidism, unspecified: Secondary | ICD-10-CM | POA: Diagnosis not present

## 2021-06-28 DIAGNOSIS — F331 Major depressive disorder, recurrent, moderate: Secondary | ICD-10-CM | POA: Diagnosis not present

## 2021-06-28 DIAGNOSIS — I10 Essential (primary) hypertension: Secondary | ICD-10-CM | POA: Diagnosis not present

## 2021-06-28 DIAGNOSIS — N951 Menopausal and female climacteric states: Secondary | ICD-10-CM | POA: Diagnosis not present

## 2021-06-28 DIAGNOSIS — E559 Vitamin D deficiency, unspecified: Secondary | ICD-10-CM | POA: Diagnosis not present

## 2021-08-01 DIAGNOSIS — N959 Unspecified menopausal and perimenopausal disorder: Secondary | ICD-10-CM | POA: Diagnosis not present

## 2021-08-01 DIAGNOSIS — Z1231 Encounter for screening mammogram for malignant neoplasm of breast: Secondary | ICD-10-CM | POA: Diagnosis not present

## 2021-08-07 DIAGNOSIS — M5416 Radiculopathy, lumbar region: Secondary | ICD-10-CM | POA: Diagnosis not present

## 2021-10-03 DIAGNOSIS — R159 Full incontinence of feces: Secondary | ICD-10-CM | POA: Diagnosis not present

## 2021-10-03 DIAGNOSIS — R197 Diarrhea, unspecified: Secondary | ICD-10-CM | POA: Diagnosis not present

## 2021-10-03 DIAGNOSIS — Z800 Family history of malignant neoplasm of digestive organs: Secondary | ICD-10-CM | POA: Diagnosis not present

## 2021-10-09 DIAGNOSIS — M5416 Radiculopathy, lumbar region: Secondary | ICD-10-CM | POA: Diagnosis not present

## 2021-10-09 DIAGNOSIS — S32058A Other fracture of fifth lumbar vertebra, initial encounter for closed fracture: Secondary | ICD-10-CM | POA: Diagnosis not present

## 2021-10-11 DIAGNOSIS — R197 Diarrhea, unspecified: Secondary | ICD-10-CM | POA: Diagnosis not present

## 2021-11-08 DIAGNOSIS — Z1211 Encounter for screening for malignant neoplasm of colon: Secondary | ICD-10-CM | POA: Diagnosis not present

## 2021-11-08 DIAGNOSIS — Z800 Family history of malignant neoplasm of digestive organs: Secondary | ICD-10-CM | POA: Diagnosis not present

## 2021-11-08 DIAGNOSIS — R197 Diarrhea, unspecified: Secondary | ICD-10-CM | POA: Diagnosis not present

## 2021-12-10 DIAGNOSIS — K580 Irritable bowel syndrome with diarrhea: Secondary | ICD-10-CM | POA: Diagnosis not present

## 2021-12-10 DIAGNOSIS — R159 Full incontinence of feces: Secondary | ICD-10-CM | POA: Diagnosis not present

## 2021-12-12 DIAGNOSIS — S32058A Other fracture of fifth lumbar vertebra, initial encounter for closed fracture: Secondary | ICD-10-CM | POA: Diagnosis not present

## 2021-12-27 DIAGNOSIS — M545 Low back pain, unspecified: Secondary | ICD-10-CM | POA: Diagnosis not present

## 2021-12-27 DIAGNOSIS — S32058A Other fracture of fifth lumbar vertebra, initial encounter for closed fracture: Secondary | ICD-10-CM | POA: Diagnosis not present

## 2021-12-27 DIAGNOSIS — R200 Anesthesia of skin: Secondary | ICD-10-CM | POA: Diagnosis not present

## 2021-12-30 DIAGNOSIS — F411 Generalized anxiety disorder: Secondary | ICD-10-CM | POA: Diagnosis not present

## 2021-12-30 DIAGNOSIS — J019 Acute sinusitis, unspecified: Secondary | ICD-10-CM | POA: Diagnosis not present

## 2021-12-30 DIAGNOSIS — Z23 Encounter for immunization: Secondary | ICD-10-CM | POA: Diagnosis not present

## 2021-12-30 DIAGNOSIS — I10 Essential (primary) hypertension: Secondary | ICD-10-CM | POA: Diagnosis not present

## 2021-12-30 DIAGNOSIS — N951 Menopausal and female climacteric states: Secondary | ICD-10-CM | POA: Diagnosis not present

## 2021-12-30 DIAGNOSIS — E785 Hyperlipidemia, unspecified: Secondary | ICD-10-CM | POA: Diagnosis not present

## 2021-12-30 DIAGNOSIS — F331 Major depressive disorder, recurrent, moderate: Secondary | ICD-10-CM | POA: Diagnosis not present

## 2021-12-30 DIAGNOSIS — E559 Vitamin D deficiency, unspecified: Secondary | ICD-10-CM | POA: Diagnosis not present

## 2021-12-30 DIAGNOSIS — E039 Hypothyroidism, unspecified: Secondary | ICD-10-CM | POA: Diagnosis not present

## 2021-12-30 DIAGNOSIS — G8929 Other chronic pain: Secondary | ICD-10-CM | POA: Diagnosis not present

## 2021-12-30 DIAGNOSIS — M545 Low back pain, unspecified: Secondary | ICD-10-CM | POA: Diagnosis not present

## 2022-01-16 DIAGNOSIS — R159 Full incontinence of feces: Secondary | ICD-10-CM | POA: Diagnosis not present

## 2022-01-16 DIAGNOSIS — K580 Irritable bowel syndrome with diarrhea: Secondary | ICD-10-CM | POA: Diagnosis not present

## 2022-01-22 DIAGNOSIS — S32058A Other fracture of fifth lumbar vertebra, initial encounter for closed fracture: Secondary | ICD-10-CM | POA: Diagnosis not present

## 2022-03-17 DIAGNOSIS — R399 Unspecified symptoms and signs involving the genitourinary system: Secondary | ICD-10-CM | POA: Diagnosis not present

## 2022-03-17 DIAGNOSIS — N390 Urinary tract infection, site not specified: Secondary | ICD-10-CM | POA: Diagnosis not present

## 2022-04-28 DIAGNOSIS — M961 Postlaminectomy syndrome, not elsewhere classified: Secondary | ICD-10-CM | POA: Diagnosis not present

## 2022-04-28 DIAGNOSIS — G8929 Other chronic pain: Secondary | ICD-10-CM | POA: Diagnosis not present

## 2022-05-05 DIAGNOSIS — I10 Essential (primary) hypertension: Secondary | ICD-10-CM | POA: Diagnosis not present

## 2022-05-05 DIAGNOSIS — R300 Dysuria: Secondary | ICD-10-CM | POA: Diagnosis not present

## 2022-05-08 DIAGNOSIS — M545 Low back pain, unspecified: Secondary | ICD-10-CM | POA: Diagnosis not present

## 2022-05-08 DIAGNOSIS — G8929 Other chronic pain: Secondary | ICD-10-CM | POA: Diagnosis not present

## 2022-05-14 DIAGNOSIS — G8929 Other chronic pain: Secondary | ICD-10-CM | POA: Diagnosis not present

## 2022-05-14 DIAGNOSIS — Z79899 Other long term (current) drug therapy: Secondary | ICD-10-CM | POA: Diagnosis not present

## 2022-05-14 DIAGNOSIS — M545 Low back pain, unspecified: Secondary | ICD-10-CM | POA: Diagnosis not present

## 2022-05-23 DIAGNOSIS — N390 Urinary tract infection, site not specified: Secondary | ICD-10-CM | POA: Diagnosis not present

## 2022-06-04 DIAGNOSIS — N390 Urinary tract infection, site not specified: Secondary | ICD-10-CM | POA: Diagnosis not present

## 2022-06-11 DIAGNOSIS — G8929 Other chronic pain: Secondary | ICD-10-CM | POA: Diagnosis not present

## 2022-06-11 DIAGNOSIS — I10 Essential (primary) hypertension: Secondary | ICD-10-CM | POA: Diagnosis not present

## 2022-06-11 DIAGNOSIS — M546 Pain in thoracic spine: Secondary | ICD-10-CM | POA: Diagnosis not present

## 2022-06-11 DIAGNOSIS — Z6821 Body mass index (BMI) 21.0-21.9, adult: Secondary | ICD-10-CM | POA: Diagnosis not present

## 2022-06-11 DIAGNOSIS — M545 Low back pain, unspecified: Secondary | ICD-10-CM | POA: Diagnosis not present

## 2022-06-11 DIAGNOSIS — Z79899 Other long term (current) drug therapy: Secondary | ICD-10-CM | POA: Diagnosis not present

## 2022-06-18 DIAGNOSIS — M545 Low back pain, unspecified: Secondary | ICD-10-CM | POA: Diagnosis not present

## 2022-06-18 DIAGNOSIS — Z79899 Other long term (current) drug therapy: Secondary | ICD-10-CM | POA: Diagnosis not present

## 2022-06-18 DIAGNOSIS — I10 Essential (primary) hypertension: Secondary | ICD-10-CM | POA: Diagnosis not present

## 2022-06-18 DIAGNOSIS — G8929 Other chronic pain: Secondary | ICD-10-CM | POA: Diagnosis not present

## 2022-06-18 DIAGNOSIS — M546 Pain in thoracic spine: Secondary | ICD-10-CM | POA: Diagnosis not present

## 2022-07-09 DIAGNOSIS — H25813 Combined forms of age-related cataract, bilateral: Secondary | ICD-10-CM | POA: Diagnosis not present

## 2022-07-21 DIAGNOSIS — Z79899 Other long term (current) drug therapy: Secondary | ICD-10-CM | POA: Diagnosis not present

## 2022-07-21 DIAGNOSIS — M546 Pain in thoracic spine: Secondary | ICD-10-CM | POA: Diagnosis not present

## 2022-07-21 DIAGNOSIS — G8929 Other chronic pain: Secondary | ICD-10-CM | POA: Diagnosis not present

## 2022-07-21 DIAGNOSIS — M545 Low back pain, unspecified: Secondary | ICD-10-CM | POA: Diagnosis not present

## 2022-08-15 DIAGNOSIS — Z79899 Other long term (current) drug therapy: Secondary | ICD-10-CM | POA: Diagnosis not present

## 2022-08-15 DIAGNOSIS — M546 Pain in thoracic spine: Secondary | ICD-10-CM | POA: Diagnosis not present

## 2022-08-15 DIAGNOSIS — G8929 Other chronic pain: Secondary | ICD-10-CM | POA: Diagnosis not present

## 2022-08-15 DIAGNOSIS — M545 Low back pain, unspecified: Secondary | ICD-10-CM | POA: Diagnosis not present

## 2022-09-12 DIAGNOSIS — M546 Pain in thoracic spine: Secondary | ICD-10-CM | POA: Diagnosis not present

## 2022-09-12 DIAGNOSIS — R300 Dysuria: Secondary | ICD-10-CM | POA: Diagnosis not present

## 2022-09-12 DIAGNOSIS — F1190 Opioid use, unspecified, uncomplicated: Secondary | ICD-10-CM | POA: Diagnosis not present

## 2022-09-12 DIAGNOSIS — R82998 Other abnormal findings in urine: Secondary | ICD-10-CM | POA: Diagnosis not present

## 2022-09-12 DIAGNOSIS — M545 Low back pain, unspecified: Secondary | ICD-10-CM | POA: Diagnosis not present

## 2022-09-12 DIAGNOSIS — Z79899 Other long term (current) drug therapy: Secondary | ICD-10-CM | POA: Diagnosis not present

## 2022-09-12 DIAGNOSIS — G8929 Other chronic pain: Secondary | ICD-10-CM | POA: Diagnosis not present

## 2022-10-07 DIAGNOSIS — H25043 Posterior subcapsular polar age-related cataract, bilateral: Secondary | ICD-10-CM | POA: Diagnosis not present

## 2022-10-07 DIAGNOSIS — H2511 Age-related nuclear cataract, right eye: Secondary | ICD-10-CM | POA: Diagnosis not present

## 2022-10-07 DIAGNOSIS — H2513 Age-related nuclear cataract, bilateral: Secondary | ICD-10-CM | POA: Diagnosis not present

## 2022-10-07 DIAGNOSIS — H18413 Arcus senilis, bilateral: Secondary | ICD-10-CM | POA: Diagnosis not present

## 2022-10-07 DIAGNOSIS — H25013 Cortical age-related cataract, bilateral: Secondary | ICD-10-CM | POA: Diagnosis not present

## 2022-10-13 DIAGNOSIS — G8929 Other chronic pain: Secondary | ICD-10-CM | POA: Diagnosis not present

## 2022-10-13 DIAGNOSIS — I10 Essential (primary) hypertension: Secondary | ICD-10-CM | POA: Diagnosis not present

## 2022-10-13 DIAGNOSIS — Z79899 Other long term (current) drug therapy: Secondary | ICD-10-CM | POA: Diagnosis not present

## 2022-10-13 DIAGNOSIS — Z6821 Body mass index (BMI) 21.0-21.9, adult: Secondary | ICD-10-CM | POA: Diagnosis not present

## 2022-10-13 DIAGNOSIS — F1190 Opioid use, unspecified, uncomplicated: Secondary | ICD-10-CM | POA: Diagnosis not present

## 2022-10-13 DIAGNOSIS — M545 Low back pain, unspecified: Secondary | ICD-10-CM | POA: Diagnosis not present

## 2022-10-13 DIAGNOSIS — M546 Pain in thoracic spine: Secondary | ICD-10-CM | POA: Diagnosis not present

## 2022-10-29 DIAGNOSIS — M5416 Radiculopathy, lumbar region: Secondary | ICD-10-CM | POA: Diagnosis not present

## 2022-10-29 DIAGNOSIS — M549 Dorsalgia, unspecified: Secondary | ICD-10-CM | POA: Diagnosis not present

## 2022-10-31 ENCOUNTER — Other Ambulatory Visit: Payer: Self-pay | Admitting: Orthopaedic Surgery

## 2022-10-31 DIAGNOSIS — M5416 Radiculopathy, lumbar region: Secondary | ICD-10-CM

## 2022-11-12 DIAGNOSIS — Z6821 Body mass index (BMI) 21.0-21.9, adult: Secondary | ICD-10-CM | POA: Diagnosis not present

## 2022-11-12 DIAGNOSIS — Z79899 Other long term (current) drug therapy: Secondary | ICD-10-CM | POA: Diagnosis not present

## 2022-11-12 DIAGNOSIS — G8929 Other chronic pain: Secondary | ICD-10-CM | POA: Diagnosis not present

## 2022-11-12 DIAGNOSIS — M545 Low back pain, unspecified: Secondary | ICD-10-CM | POA: Diagnosis not present

## 2022-11-12 DIAGNOSIS — I10 Essential (primary) hypertension: Secondary | ICD-10-CM | POA: Diagnosis not present

## 2022-11-12 DIAGNOSIS — F1190 Opioid use, unspecified, uncomplicated: Secondary | ICD-10-CM | POA: Diagnosis not present

## 2022-11-12 DIAGNOSIS — M546 Pain in thoracic spine: Secondary | ICD-10-CM | POA: Diagnosis not present

## 2022-11-18 ENCOUNTER — Other Ambulatory Visit: Payer: PPO

## 2022-11-25 ENCOUNTER — Ambulatory Visit
Admission: RE | Admit: 2022-11-25 | Discharge: 2022-11-25 | Disposition: A | Discharge status: Home / Self Care | Payer: PPO | Source: Ambulatory Visit | Attending: Orthopaedic Surgery | Admitting: Orthopaedic Surgery

## 2022-11-25 DIAGNOSIS — M5416 Radiculopathy, lumbar region: Secondary | ICD-10-CM

## 2022-11-25 DIAGNOSIS — Z981 Arthrodesis status: Secondary | ICD-10-CM | POA: Diagnosis not present

## 2022-11-26 DIAGNOSIS — Z9181 History of falling: Secondary | ICD-10-CM | POA: Diagnosis not present

## 2022-11-26 DIAGNOSIS — Z0000 Encounter for general adult medical examination without abnormal findings: Secondary | ICD-10-CM | POA: Diagnosis not present

## 2022-11-28 DIAGNOSIS — Z1231 Encounter for screening mammogram for malignant neoplasm of breast: Secondary | ICD-10-CM | POA: Diagnosis not present

## 2022-12-05 DIAGNOSIS — G8929 Other chronic pain: Secondary | ICD-10-CM | POA: Diagnosis not present

## 2022-12-05 DIAGNOSIS — Z6821 Body mass index (BMI) 21.0-21.9, adult: Secondary | ICD-10-CM | POA: Diagnosis not present

## 2022-12-05 DIAGNOSIS — E559 Vitamin D deficiency, unspecified: Secondary | ICD-10-CM | POA: Diagnosis not present

## 2022-12-05 DIAGNOSIS — I10 Essential (primary) hypertension: Secondary | ICD-10-CM | POA: Diagnosis not present

## 2022-12-05 DIAGNOSIS — M545 Low back pain, unspecified: Secondary | ICD-10-CM | POA: Diagnosis not present

## 2022-12-05 DIAGNOSIS — F331 Major depressive disorder, recurrent, moderate: Secondary | ICD-10-CM | POA: Diagnosis not present

## 2022-12-05 DIAGNOSIS — Z23 Encounter for immunization: Secondary | ICD-10-CM | POA: Diagnosis not present

## 2022-12-05 DIAGNOSIS — G4700 Insomnia, unspecified: Secondary | ICD-10-CM | POA: Diagnosis not present

## 2022-12-05 DIAGNOSIS — E785 Hyperlipidemia, unspecified: Secondary | ICD-10-CM | POA: Diagnosis not present

## 2022-12-05 DIAGNOSIS — N951 Menopausal and female climacteric states: Secondary | ICD-10-CM | POA: Diagnosis not present

## 2022-12-05 DIAGNOSIS — E039 Hypothyroidism, unspecified: Secondary | ICD-10-CM | POA: Diagnosis not present

## 2022-12-12 DIAGNOSIS — M545 Low back pain, unspecified: Secondary | ICD-10-CM | POA: Diagnosis not present

## 2022-12-12 DIAGNOSIS — Z6821 Body mass index (BMI) 21.0-21.9, adult: Secondary | ICD-10-CM | POA: Diagnosis not present

## 2022-12-12 DIAGNOSIS — F1190 Opioid use, unspecified, uncomplicated: Secondary | ICD-10-CM | POA: Diagnosis not present

## 2022-12-12 DIAGNOSIS — M546 Pain in thoracic spine: Secondary | ICD-10-CM | POA: Diagnosis not present

## 2022-12-12 DIAGNOSIS — Z79899 Other long term (current) drug therapy: Secondary | ICD-10-CM | POA: Diagnosis not present

## 2022-12-12 DIAGNOSIS — G8929 Other chronic pain: Secondary | ICD-10-CM | POA: Diagnosis not present

## 2022-12-17 DIAGNOSIS — G894 Chronic pain syndrome: Secondary | ICD-10-CM | POA: Diagnosis not present

## 2022-12-17 DIAGNOSIS — M5416 Radiculopathy, lumbar region: Secondary | ICD-10-CM | POA: Diagnosis not present

## 2023-01-12 DIAGNOSIS — G8929 Other chronic pain: Secondary | ICD-10-CM | POA: Diagnosis not present

## 2023-01-12 DIAGNOSIS — M546 Pain in thoracic spine: Secondary | ICD-10-CM | POA: Diagnosis not present

## 2023-01-12 DIAGNOSIS — Z79899 Other long term (current) drug therapy: Secondary | ICD-10-CM | POA: Diagnosis not present

## 2023-01-12 DIAGNOSIS — Z6821 Body mass index (BMI) 21.0-21.9, adult: Secondary | ICD-10-CM | POA: Diagnosis not present

## 2023-01-12 DIAGNOSIS — F1190 Opioid use, unspecified, uncomplicated: Secondary | ICD-10-CM | POA: Diagnosis not present

## 2023-01-12 DIAGNOSIS — M545 Low back pain, unspecified: Secondary | ICD-10-CM | POA: Diagnosis not present

## 2023-02-04 DIAGNOSIS — M5489 Other dorsalgia: Secondary | ICD-10-CM | POA: Diagnosis not present

## 2023-02-04 DIAGNOSIS — M2560 Stiffness of unspecified joint, not elsewhere classified: Secondary | ICD-10-CM | POA: Diagnosis not present

## 2023-02-04 DIAGNOSIS — R2689 Other abnormalities of gait and mobility: Secondary | ICD-10-CM | POA: Diagnosis not present

## 2023-02-09 DIAGNOSIS — M545 Low back pain, unspecified: Secondary | ICD-10-CM | POA: Diagnosis not present

## 2023-02-09 DIAGNOSIS — Z79899 Other long term (current) drug therapy: Secondary | ICD-10-CM | POA: Diagnosis not present

## 2023-02-09 DIAGNOSIS — F1190 Opioid use, unspecified, uncomplicated: Secondary | ICD-10-CM | POA: Diagnosis not present

## 2023-02-09 DIAGNOSIS — G8929 Other chronic pain: Secondary | ICD-10-CM | POA: Diagnosis not present

## 2023-02-09 DIAGNOSIS — M546 Pain in thoracic spine: Secondary | ICD-10-CM | POA: Diagnosis not present

## 2023-02-10 DIAGNOSIS — N390 Urinary tract infection, site not specified: Secondary | ICD-10-CM | POA: Diagnosis not present

## 2023-03-12 DIAGNOSIS — Z6822 Body mass index (BMI) 22.0-22.9, adult: Secondary | ICD-10-CM | POA: Diagnosis not present

## 2023-03-12 DIAGNOSIS — Z79899 Other long term (current) drug therapy: Secondary | ICD-10-CM | POA: Diagnosis not present

## 2023-03-12 DIAGNOSIS — M791 Myalgia, unspecified site: Secondary | ICD-10-CM | POA: Diagnosis not present

## 2023-03-12 DIAGNOSIS — G8929 Other chronic pain: Secondary | ICD-10-CM | POA: Diagnosis not present

## 2023-03-12 DIAGNOSIS — M545 Low back pain, unspecified: Secondary | ICD-10-CM | POA: Diagnosis not present

## 2023-03-12 DIAGNOSIS — F1190 Opioid use, unspecified, uncomplicated: Secondary | ICD-10-CM | POA: Diagnosis not present

## 2023-04-13 DIAGNOSIS — F1190 Opioid use, unspecified, uncomplicated: Secondary | ICD-10-CM | POA: Diagnosis not present

## 2023-04-13 DIAGNOSIS — Z79899 Other long term (current) drug therapy: Secondary | ICD-10-CM | POA: Diagnosis not present

## 2023-04-13 DIAGNOSIS — G8929 Other chronic pain: Secondary | ICD-10-CM | POA: Diagnosis not present

## 2023-04-13 DIAGNOSIS — Z6822 Body mass index (BMI) 22.0-22.9, adult: Secondary | ICD-10-CM | POA: Diagnosis not present

## 2023-04-13 DIAGNOSIS — M791 Myalgia, unspecified site: Secondary | ICD-10-CM | POA: Diagnosis not present

## 2023-04-13 DIAGNOSIS — M545 Low back pain, unspecified: Secondary | ICD-10-CM | POA: Diagnosis not present

## 2023-05-11 DIAGNOSIS — F1190 Opioid use, unspecified, uncomplicated: Secondary | ICD-10-CM | POA: Diagnosis not present

## 2023-05-11 DIAGNOSIS — G8929 Other chronic pain: Secondary | ICD-10-CM | POA: Diagnosis not present

## 2023-05-11 DIAGNOSIS — R82998 Other abnormal findings in urine: Secondary | ICD-10-CM | POA: Diagnosis not present

## 2023-05-11 DIAGNOSIS — M545 Low back pain, unspecified: Secondary | ICD-10-CM | POA: Diagnosis not present

## 2023-05-11 DIAGNOSIS — M791 Myalgia, unspecified site: Secondary | ICD-10-CM | POA: Diagnosis not present

## 2023-05-11 DIAGNOSIS — Z79899 Other long term (current) drug therapy: Secondary | ICD-10-CM | POA: Diagnosis not present

## 2023-05-11 DIAGNOSIS — Z6822 Body mass index (BMI) 22.0-22.9, adult: Secondary | ICD-10-CM | POA: Diagnosis not present

## 2023-05-11 DIAGNOSIS — R300 Dysuria: Secondary | ICD-10-CM | POA: Diagnosis not present

## 2023-05-28 DIAGNOSIS — I10 Essential (primary) hypertension: Secondary | ICD-10-CM | POA: Diagnosis not present

## 2023-05-28 DIAGNOSIS — Z6821 Body mass index (BMI) 21.0-21.9, adult: Secondary | ICD-10-CM | POA: Diagnosis not present

## 2023-05-28 DIAGNOSIS — E785 Hyperlipidemia, unspecified: Secondary | ICD-10-CM | POA: Diagnosis not present

## 2023-05-28 DIAGNOSIS — E612 Magnesium deficiency: Secondary | ICD-10-CM | POA: Diagnosis not present

## 2023-05-28 DIAGNOSIS — G8929 Other chronic pain: Secondary | ICD-10-CM | POA: Diagnosis not present

## 2023-05-28 DIAGNOSIS — F331 Major depressive disorder, recurrent, moderate: Secondary | ICD-10-CM | POA: Diagnosis not present

## 2023-05-28 DIAGNOSIS — E559 Vitamin D deficiency, unspecified: Secondary | ICD-10-CM | POA: Diagnosis not present

## 2023-05-28 DIAGNOSIS — I700 Atherosclerosis of aorta: Secondary | ICD-10-CM | POA: Diagnosis not present

## 2023-05-28 DIAGNOSIS — M545 Low back pain, unspecified: Secondary | ICD-10-CM | POA: Diagnosis not present

## 2023-05-28 DIAGNOSIS — E039 Hypothyroidism, unspecified: Secondary | ICD-10-CM | POA: Diagnosis not present

## 2023-05-28 DIAGNOSIS — F411 Generalized anxiety disorder: Secondary | ICD-10-CM | POA: Diagnosis not present

## 2023-06-12 DIAGNOSIS — I10 Essential (primary) hypertension: Secondary | ICD-10-CM | POA: Diagnosis not present

## 2023-06-12 DIAGNOSIS — Z79899 Other long term (current) drug therapy: Secondary | ICD-10-CM | POA: Diagnosis not present

## 2023-06-12 DIAGNOSIS — M791 Myalgia, unspecified site: Secondary | ICD-10-CM | POA: Diagnosis not present

## 2023-06-12 DIAGNOSIS — G8929 Other chronic pain: Secondary | ICD-10-CM | POA: Diagnosis not present

## 2023-06-12 DIAGNOSIS — F1190 Opioid use, unspecified, uncomplicated: Secondary | ICD-10-CM | POA: Diagnosis not present

## 2023-06-12 DIAGNOSIS — Z6822 Body mass index (BMI) 22.0-22.9, adult: Secondary | ICD-10-CM | POA: Diagnosis not present

## 2023-06-12 DIAGNOSIS — M545 Low back pain, unspecified: Secondary | ICD-10-CM | POA: Diagnosis not present

## 2023-06-16 DIAGNOSIS — Z79899 Other long term (current) drug therapy: Secondary | ICD-10-CM | POA: Diagnosis not present

## 2023-07-10 DIAGNOSIS — Z79899 Other long term (current) drug therapy: Secondary | ICD-10-CM | POA: Diagnosis not present

## 2023-07-10 DIAGNOSIS — M791 Myalgia, unspecified site: Secondary | ICD-10-CM | POA: Diagnosis not present

## 2023-07-10 DIAGNOSIS — M545 Low back pain, unspecified: Secondary | ICD-10-CM | POA: Diagnosis not present

## 2023-07-10 DIAGNOSIS — Z6821 Body mass index (BMI) 21.0-21.9, adult: Secondary | ICD-10-CM | POA: Diagnosis not present

## 2023-07-10 DIAGNOSIS — F1190 Opioid use, unspecified, uncomplicated: Secondary | ICD-10-CM | POA: Diagnosis not present

## 2023-07-10 DIAGNOSIS — G8929 Other chronic pain: Secondary | ICD-10-CM | POA: Diagnosis not present

## 2023-07-24 DIAGNOSIS — I10 Essential (primary) hypertension: Secondary | ICD-10-CM | POA: Diagnosis not present

## 2023-07-24 DIAGNOSIS — F411 Generalized anxiety disorder: Secondary | ICD-10-CM | POA: Diagnosis not present

## 2023-07-24 DIAGNOSIS — Z6822 Body mass index (BMI) 22.0-22.9, adult: Secondary | ICD-10-CM | POA: Diagnosis not present

## 2023-07-24 DIAGNOSIS — R413 Other amnesia: Secondary | ICD-10-CM | POA: Diagnosis not present

## 2023-07-24 DIAGNOSIS — R519 Headache, unspecified: Secondary | ICD-10-CM | POA: Diagnosis not present

## 2023-08-04 DIAGNOSIS — R930 Abnormal findings on diagnostic imaging of skull and head, not elsewhere classified: Secondary | ICD-10-CM | POA: Diagnosis not present

## 2023-08-04 DIAGNOSIS — R519 Headache, unspecified: Secondary | ICD-10-CM | POA: Diagnosis not present

## 2023-08-05 DIAGNOSIS — R519 Headache, unspecified: Secondary | ICD-10-CM | POA: Diagnosis not present

## 2023-08-05 DIAGNOSIS — R110 Nausea: Secondary | ICD-10-CM | POA: Diagnosis not present

## 2023-08-10 DIAGNOSIS — Z79899 Other long term (current) drug therapy: Secondary | ICD-10-CM | POA: Diagnosis not present

## 2023-08-10 DIAGNOSIS — G8929 Other chronic pain: Secondary | ICD-10-CM | POA: Diagnosis not present

## 2023-08-10 DIAGNOSIS — F1190 Opioid use, unspecified, uncomplicated: Secondary | ICD-10-CM | POA: Diagnosis not present

## 2023-08-10 DIAGNOSIS — Z6821 Body mass index (BMI) 21.0-21.9, adult: Secondary | ICD-10-CM | POA: Diagnosis not present

## 2023-08-10 DIAGNOSIS — M545 Low back pain, unspecified: Secondary | ICD-10-CM | POA: Diagnosis not present

## 2023-08-10 DIAGNOSIS — M791 Myalgia, unspecified site: Secondary | ICD-10-CM | POA: Diagnosis not present

## 2023-08-11 ENCOUNTER — Encounter: Payer: Self-pay | Admitting: Neurology

## 2023-08-12 DIAGNOSIS — Z79899 Other long term (current) drug therapy: Secondary | ICD-10-CM | POA: Diagnosis not present

## 2023-09-10 DIAGNOSIS — M791 Myalgia, unspecified site: Secondary | ICD-10-CM | POA: Diagnosis not present

## 2023-09-10 DIAGNOSIS — Z6821 Body mass index (BMI) 21.0-21.9, adult: Secondary | ICD-10-CM | POA: Diagnosis not present

## 2023-09-10 DIAGNOSIS — Z79899 Other long term (current) drug therapy: Secondary | ICD-10-CM | POA: Diagnosis not present

## 2023-09-10 DIAGNOSIS — G8929 Other chronic pain: Secondary | ICD-10-CM | POA: Diagnosis not present

## 2023-09-10 DIAGNOSIS — M545 Low back pain, unspecified: Secondary | ICD-10-CM | POA: Diagnosis not present

## 2023-09-10 DIAGNOSIS — F1120 Opioid dependence, uncomplicated: Secondary | ICD-10-CM | POA: Diagnosis not present

## 2023-09-30 DIAGNOSIS — G8929 Other chronic pain: Secondary | ICD-10-CM | POA: Diagnosis not present

## 2023-09-30 DIAGNOSIS — M25512 Pain in left shoulder: Secondary | ICD-10-CM | POA: Diagnosis not present

## 2023-10-12 DIAGNOSIS — Z79899 Other long term (current) drug therapy: Secondary | ICD-10-CM | POA: Diagnosis not present

## 2023-10-12 DIAGNOSIS — M791 Myalgia, unspecified site: Secondary | ICD-10-CM | POA: Diagnosis not present

## 2023-10-12 DIAGNOSIS — M545 Low back pain, unspecified: Secondary | ICD-10-CM | POA: Diagnosis not present

## 2023-10-12 DIAGNOSIS — G8929 Other chronic pain: Secondary | ICD-10-CM | POA: Diagnosis not present

## 2023-10-12 DIAGNOSIS — F1120 Opioid dependence, uncomplicated: Secondary | ICD-10-CM | POA: Diagnosis not present

## 2023-11-04 ENCOUNTER — Encounter: Payer: Self-pay | Admitting: Neurology

## 2023-11-12 DIAGNOSIS — M545 Low back pain, unspecified: Secondary | ICD-10-CM | POA: Diagnosis not present

## 2023-11-12 DIAGNOSIS — E039 Hypothyroidism, unspecified: Secondary | ICD-10-CM | POA: Diagnosis not present

## 2023-11-12 DIAGNOSIS — G8929 Other chronic pain: Secondary | ICD-10-CM | POA: Diagnosis not present

## 2023-11-12 DIAGNOSIS — Z79899 Other long term (current) drug therapy: Secondary | ICD-10-CM | POA: Diagnosis not present

## 2023-11-12 DIAGNOSIS — I10 Essential (primary) hypertension: Secondary | ICD-10-CM | POA: Diagnosis not present

## 2023-11-16 NOTE — Progress Notes (Unsigned)
 NEUROLOGY CONSULTATION NOTE  Jean Delacruz MRN: [id number] DOB: 1950/05/10  Referring provider: Greig Feeling, NP Primary care provider: Greig Feeling, NP  Reason for consult:  white matter abnormality on brain MRI  Assessment/Plan:   Cerebrovascular disease Memory problems - at this time, appears to be normal age-related. Occipital headache, isolated event - may have been cervicogenic   Recommend PCP to optimize treatment of stroke risk factors:  HTN, HLD, etc Will check B12 level.  If memory becomes worse, she may contact us  for further evaluation.    Total time spent on today's visit was 31 minutes dedicated to this patient today, preparing to see patient, examining the patient, ordering tests and/or medications and counseling the patient, documenting clinical information in the EHR or other health record, independently interpreting results and communicating results to the patient/family, discussing treatment and goals, answering patient's questions and coordinating care.    Subjective:  Jean Delacruz is a 73 year old right-handed female with HTN, macular degeneration, anxiety, hypothyroidism, chronic back pain and chronic pain syndrome who presents for abnormal brain MRI.  History supplemented by referring provider's note.  She has a remote history of headaches but not for decades.  In May, she developed new headache.  She bent down to place something in the trash can and developed a severe pounding/stabbing pain in the back of her head that lasted just a less than a minute.  It was the worst headache of her life.  She has not had a recurrence.  She does report some mild neck discomfort when she turns her neck.    She had an MRI of the brain without contrast on 08/04/2023 which revealed abnormal increased T2 and FLAIR signal in the deep white matter on the right and left with moderate white matter disease.  The largest lesion in the subcortical region in the right frontal lobe  measures 0.8 cm, axial image 30/52.  The largest lesion in the deep white matter in the left anterior periventricular region measures 0.6 cm.  She has not had a recurrence of the headache.  She does endorse having some memory difficulties.  She may forget why she walked into a room, particularly if she had been sidetracked before getting there.  She may forget a word or name but will eventually recall it.  She may misplace an item.  She denies difficulty with complex tasks such as driving or paying bills. She does not get disoriented driving on familiar routes.  She does report emotional stressors.  Other people have not told her that they are worried about her memory.  Her aunt had Alzheimer's disease.  Labs from 05/28/2023 revealed TSH 0.706  Forget why got in room    Current NSAIDs/analgesics:  naproxen , oxycodone  Current antihypertensive:  lisinopril -hydrochlorothiazide  Current antidepressant:  trazodone  150-300mg  at bedtime Current antiepileptic:  gabapentin  600mg  TID Current vitamins/supplements:  magnesium oxide 500mg  daily   PAST MEDICAL HISTORY: Past Medical History:  Diagnosis Date   Anxiety disorder 11/09/2013   Arthritis    Chronic pain    Depression 05/19/2014   Headache    migraines   Hypertension    Hypothyroidism    PONV (postoperative nausea and vomiting)     PAST SURGICAL HISTORY: Past Surgical History:  Procedure Laterality Date   ABDOMINAL EXPOSURE N/A 04/12/2021   Procedure: ABDOMINAL EXPOSURE;  Surgeon: Gretta Lonni PARAS, MD;  Location: [facility] OR;  Service: Vascular;  Laterality: N/A;   ABDOMINAL HYSTERECTOMY     ANTERIOR LATERAL LUMBAR  FUSION WITH PERCUTANEOUS SCREW 2 LEVEL Left 02/21/2019   Procedure: Anterior Lateral Fusion Lumbar Two-Three/Lumbar Three-Four with unilateral percutaneous pedicle screws;  Surgeon: Louis Shove, MD;  Location: [facility] OR;  Service: Neurosurgery;  Laterality: Left;  Anterior Lateral Fusion Lumbar Two-Three/Lumbar Three-Four with  percutaneous pedicle screws   ANTERIOR LUMBAR FUSION N/A 04/12/2021   Procedure: Anterior Lumbar Interbody Fusion - Lumbar five-Sacral one;  Surgeon: Louis Shove, MD;  Location: [facility] OR;  Service: Neurosurgery;  Laterality: N/A;   BACK SURGERY     BLADDER SURGERY  2007   CESAREAN SECTION     x2   CYST EXCISION     scalp   galion cyst left hand x2     HARDWARE REMOVAL Right 05/17/2021   Procedure: Revision of right Sacral One pedicle screw;  Surgeon: Louis Shove, MD;  Location: [facility] OR;  Service: Neurosurgery;  Laterality: Right;   LAMINECTOMY WITH POSTERIOR LATERAL ARTHRODESIS LEVEL 2 N/A 04/12/2021   Procedure: Lumbar four - Sacral one posterior lateral fusion;  Surgeon: Louis Shove, MD;  Location: [facility] OR;  Service: Neurosurgery;  Laterality: N/A;   NASAL SINUS SURGERY     TONSILLECTOMY     TUBAL LIGATION      MEDICATIONS: Current Outpatient Medications on File Prior to Visit  Medication Sig Dispense Refill   levothyroxine  (SYNTHROID , LEVOTHROID) 25 MCG tablet Take 25 mcg by mouth daily before breakfast.      lisinopril -hydrochlorothiazide  (ZESTORETIC ) 20-25 MG tablet Take 1 tablet by mouth daily.      Multiple Vitamins-Minerals (PRESERVISION AREDS PO) Take 1 tablet by mouth in the morning and at bedtime.     Oxycodone  HCl 10 MG TABS Take 1 tablet (10 mg total) by mouth every 4 (four) hours as needed (pain.). 40 tablet 0   pravastatin  (PRAVACHOL ) 20 MG tablet Take 20 mg by mouth at bedtime.   4   traZODone  (DESYREL ) 150 MG tablet Take 150 mg by mouth at bedtime.     No current facility-administered medications on file prior to visit.     ALLERGIES: No Known Allergies  FAMILY HISTORY: No family history on file.  Objective:  Blood pressure 123/71, pulse 70, height 5' 2 (1.575 m), weight 123 lb (55.8 kg), SpO2 94%. General: No acute distress.  Patient appears well-groomed.   Head:  Normocephalic/atraumatic Eyes:  fundi examined but not visualized Neck: supple, no paraspinal  tenderness, full range of motion Heart: regular rate and rhythm Neurological Exam: Mental status: alert and oriented to person, place, and time, speech fluent and not dysarthric, language intact. Cranial nerves: CN I: not tested CN II: pupils equal, round and reactive to light, visual fields intact CN III, IV, VI:  full range of motion, no nystagmus, no ptosis CN V: facial sensation intact. CN VII: upper and lower face symmetric CN VIII: hearing intact CN IX, X: gag intact, uvula midline CN XI: sternocleidomastoid and trapezius muscles intact CN XII: tongue midline Bulk & Tone: normal, no fasciculations. Motor:  muscle strength 5/5 throughout Sensation:  Pinprick and vibratory sensation intact. Deep Tendon Reflexes:  2+ throughout,  toes downgoing.   Finger to nose testing:  Without dysmetria.   Gait:  Normal station and stride.  Romberg negative.    Thank you for allowing me to take part in the care of this patient.  Juliene Dunnings, DO  CC: Greig Feeling, NP

## 2023-11-17 ENCOUNTER — Other Ambulatory Visit

## 2023-11-17 ENCOUNTER — Encounter: Payer: Self-pay | Admitting: Neurology

## 2023-11-17 ENCOUNTER — Ambulatory Visit (INDEPENDENT_AMBULATORY_CARE_PROVIDER_SITE_OTHER): Admitting: Neurology

## 2023-11-17 VITALS — BP 123/71 | HR 70 | Ht 62.0 in | Wt 123.0 lb

## 2023-11-17 DIAGNOSIS — R413 Other amnesia: Secondary | ICD-10-CM

## 2023-11-17 DIAGNOSIS — I679 Cerebrovascular disease, unspecified: Secondary | ICD-10-CM | POA: Diagnosis not present

## 2023-11-17 LAB — VITAMIN B12: Vitamin B-12: 967 pg/mL (ref 200–1100)

## 2023-11-17 NOTE — Patient Instructions (Signed)
 The findings on MRI are age-related changes Will check B12 If memory gets worse, contact us 

## 2023-11-18 ENCOUNTER — Ambulatory Visit: Payer: Self-pay | Admitting: Neurology

## 2023-11-20 NOTE — Progress Notes (Signed)
 Patient advised.

## 2023-11-30 DIAGNOSIS — Z79899 Other long term (current) drug therapy: Secondary | ICD-10-CM | POA: Diagnosis not present

## 2023-11-30 DIAGNOSIS — Z6821 Body mass index (BMI) 21.0-21.9, adult: Secondary | ICD-10-CM | POA: Diagnosis not present

## 2023-11-30 DIAGNOSIS — F1120 Opioid dependence, uncomplicated: Secondary | ICD-10-CM | POA: Diagnosis not present

## 2023-11-30 DIAGNOSIS — M791 Myalgia, unspecified site: Secondary | ICD-10-CM | POA: Diagnosis not present

## 2023-11-30 DIAGNOSIS — G8929 Other chronic pain: Secondary | ICD-10-CM | POA: Diagnosis not present

## 2023-11-30 DIAGNOSIS — T24019A Burn of unspecified degree of unspecified thigh, initial encounter: Secondary | ICD-10-CM | POA: Diagnosis not present

## 2023-11-30 DIAGNOSIS — I10 Essential (primary) hypertension: Secondary | ICD-10-CM | POA: Diagnosis not present

## 2023-12-04 DIAGNOSIS — I10 Essential (primary) hypertension: Secondary | ICD-10-CM | POA: Diagnosis not present

## 2023-12-04 DIAGNOSIS — K580 Irritable bowel syndrome with diarrhea: Secondary | ICD-10-CM | POA: Diagnosis not present

## 2023-12-04 DIAGNOSIS — E559 Vitamin D deficiency, unspecified: Secondary | ICD-10-CM | POA: Diagnosis not present

## 2023-12-04 DIAGNOSIS — M545 Low back pain, unspecified: Secondary | ICD-10-CM | POA: Diagnosis not present

## 2023-12-04 DIAGNOSIS — Z79899 Other long term (current) drug therapy: Secondary | ICD-10-CM | POA: Diagnosis not present

## 2023-12-04 DIAGNOSIS — F331 Major depressive disorder, recurrent, moderate: Secondary | ICD-10-CM | POA: Diagnosis not present

## 2023-12-04 DIAGNOSIS — G8929 Other chronic pain: Secondary | ICD-10-CM | POA: Diagnosis not present

## 2023-12-04 DIAGNOSIS — E612 Magnesium deficiency: Secondary | ICD-10-CM | POA: Diagnosis not present

## 2023-12-04 DIAGNOSIS — E785 Hyperlipidemia, unspecified: Secondary | ICD-10-CM | POA: Diagnosis not present

## 2023-12-04 DIAGNOSIS — G4700 Insomnia, unspecified: Secondary | ICD-10-CM | POA: Diagnosis not present

## 2023-12-04 DIAGNOSIS — E039 Hypothyroidism, unspecified: Secondary | ICD-10-CM | POA: Diagnosis not present

## 2023-12-04 DIAGNOSIS — F411 Generalized anxiety disorder: Secondary | ICD-10-CM | POA: Diagnosis not present

## 2023-12-04 DIAGNOSIS — I700 Atherosclerosis of aorta: Secondary | ICD-10-CM | POA: Diagnosis not present

## 2023-12-04 DIAGNOSIS — Z780 Asymptomatic menopausal state: Secondary | ICD-10-CM | POA: Diagnosis not present

## 2023-12-04 DIAGNOSIS — Z23 Encounter for immunization: Secondary | ICD-10-CM | POA: Diagnosis not present

## 2023-12-21 ENCOUNTER — Ambulatory Visit: Admitting: Neurology

## 2024-01-11 DIAGNOSIS — M791 Myalgia, unspecified site: Secondary | ICD-10-CM | POA: Diagnosis not present

## 2024-01-11 DIAGNOSIS — F1120 Opioid dependence, uncomplicated: Secondary | ICD-10-CM | POA: Diagnosis not present

## 2024-01-11 DIAGNOSIS — M545 Low back pain, unspecified: Secondary | ICD-10-CM | POA: Diagnosis not present

## 2024-01-11 DIAGNOSIS — I10 Essential (primary) hypertension: Secondary | ICD-10-CM | POA: Diagnosis not present

## 2024-01-29 DIAGNOSIS — M5432 Sciatica, left side: Secondary | ICD-10-CM | POA: Diagnosis not present
